# Patient Record
Sex: Female | Born: 1975 | Race: White | Hispanic: No | Marital: Single | Smoking: Never smoker
Health system: Southern US, Community
[De-identification: ages and names within clinical notes are randomized; demographics above are authoritative.]

## PROBLEM LIST (undated history)

## (undated) DIAGNOSIS — R112 Nausea with vomiting, unspecified: Secondary | ICD-10-CM

## (undated) DIAGNOSIS — K219 Gastro-esophageal reflux disease without esophagitis: Secondary | ICD-10-CM

## (undated) DIAGNOSIS — M5430 Sciatica, unspecified side: Secondary | ICD-10-CM

## (undated) DIAGNOSIS — Z9889 Other specified postprocedural states: Secondary | ICD-10-CM

## (undated) HISTORY — PX: BREAST SURGERY: SHX581

## (undated) HISTORY — PX: WISDOM TOOTH EXTRACTION: SHX21

---

## 2005-07-13 ENCOUNTER — Other Ambulatory Visit: Admission: RE | Admit: 2005-07-13 | Discharge: 2005-07-13 | Payer: Self-pay | Admitting: Endocrinology

## 2006-12-03 ENCOUNTER — Inpatient Hospital Stay (HOSPITAL_COMMUNITY): Admission: AD | Admit: 2006-12-03 | Discharge: 2006-12-03 | Payer: Self-pay | Admitting: Obstetrics and Gynecology

## 2007-02-23 ENCOUNTER — Ambulatory Visit (HOSPITAL_COMMUNITY): Admission: RE | Admit: 2007-02-23 | Discharge: 2007-02-23 | Payer: Self-pay | Admitting: Obstetrics & Gynecology

## 2007-02-28 ENCOUNTER — Inpatient Hospital Stay (HOSPITAL_COMMUNITY): Admission: AD | Admit: 2007-02-28 | Discharge: 2007-02-28 | Payer: Self-pay | Admitting: Obstetrics and Gynecology

## 2007-03-01 ENCOUNTER — Inpatient Hospital Stay (HOSPITAL_COMMUNITY): Admission: AD | Admit: 2007-03-01 | Discharge: 2007-03-01 | Payer: Self-pay | Admitting: Obstetrics and Gynecology

## 2007-03-29 ENCOUNTER — Inpatient Hospital Stay (HOSPITAL_COMMUNITY): Admission: AD | Admit: 2007-03-29 | Discharge: 2007-03-29 | Payer: Self-pay | Admitting: Specialist

## 2007-03-30 ENCOUNTER — Inpatient Hospital Stay (HOSPITAL_COMMUNITY): Admission: AD | Admit: 2007-03-30 | Discharge: 2007-03-30 | Payer: Self-pay | Admitting: Obstetrics and Gynecology

## 2007-11-16 ENCOUNTER — Inpatient Hospital Stay (HOSPITAL_COMMUNITY): Admission: AD | Admit: 2007-11-16 | Discharge: 2007-11-20 | Payer: Self-pay | Admitting: Obstetrics and Gynecology

## 2007-11-17 ENCOUNTER — Encounter: Payer: Self-pay | Admitting: Obstetrics and Gynecology

## 2007-11-26 ENCOUNTER — Inpatient Hospital Stay (HOSPITAL_COMMUNITY): Admission: AD | Admit: 2007-11-26 | Discharge: 2007-11-26 | Payer: Self-pay | Admitting: Obstetrics and Gynecology

## 2007-12-27 ENCOUNTER — Inpatient Hospital Stay (HOSPITAL_COMMUNITY): Admission: AD | Admit: 2007-12-27 | Discharge: 2007-12-27 | Payer: Self-pay | Admitting: Obstetrics and Gynecology

## 2007-12-30 ENCOUNTER — Inpatient Hospital Stay (HOSPITAL_COMMUNITY): Admission: AD | Admit: 2007-12-30 | Discharge: 2007-12-30 | Payer: Self-pay | Admitting: Obstetrics and Gynecology

## 2008-01-02 ENCOUNTER — Inpatient Hospital Stay (HOSPITAL_COMMUNITY): Admission: AD | Admit: 2008-01-02 | Discharge: 2008-01-02 | Payer: Self-pay | Admitting: Obstetrics and Gynecology

## 2008-01-02 ENCOUNTER — Ambulatory Visit: Payer: Self-pay | Admitting: Obstetrics & Gynecology

## 2008-01-09 ENCOUNTER — Ambulatory Visit: Payer: Self-pay | Admitting: Obstetrics & Gynecology

## 2008-01-19 ENCOUNTER — Inpatient Hospital Stay (HOSPITAL_COMMUNITY): Admission: RE | Admit: 2008-01-19 | Discharge: 2008-01-22 | Payer: Self-pay | Admitting: Obstetrics & Gynecology

## 2008-01-19 ENCOUNTER — Encounter (INDEPENDENT_AMBULATORY_CARE_PROVIDER_SITE_OTHER): Payer: Self-pay | Admitting: Obstetrics & Gynecology

## 2009-11-28 IMAGING — US US OB DETAIL EACH ADDL GEST + 14 WK
1 series · 18 of 28 positions shown · non-contrast
Comparison: none

OBSTETRICAL ULTRASOUND:
 This ultrasound was performed in The [HOSPITAL], and the [facility] report will be stored to [REDACTED] PACS.

[Series 1: us ob detail each addl gest + 14 wk · 18 of 73 slices shown]
[im 1/73]
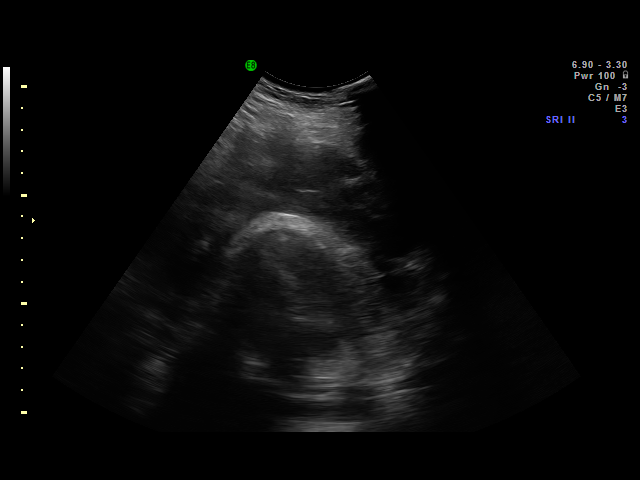
[im 6/73]
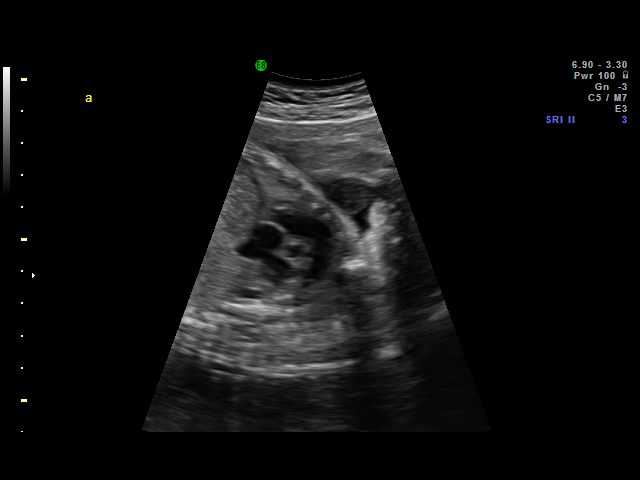
[im 9/73]
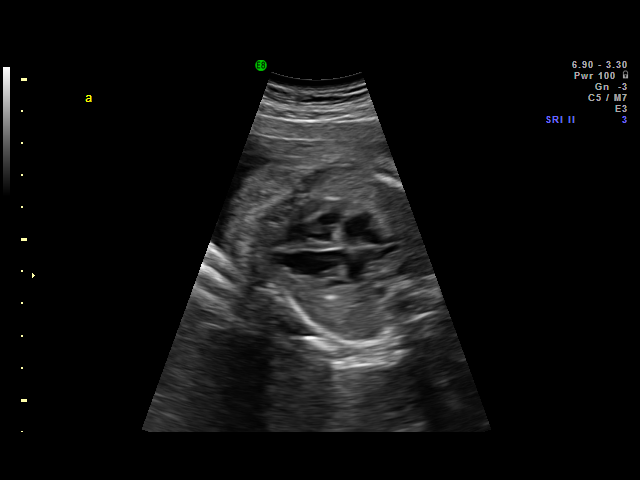
[im 14/73]
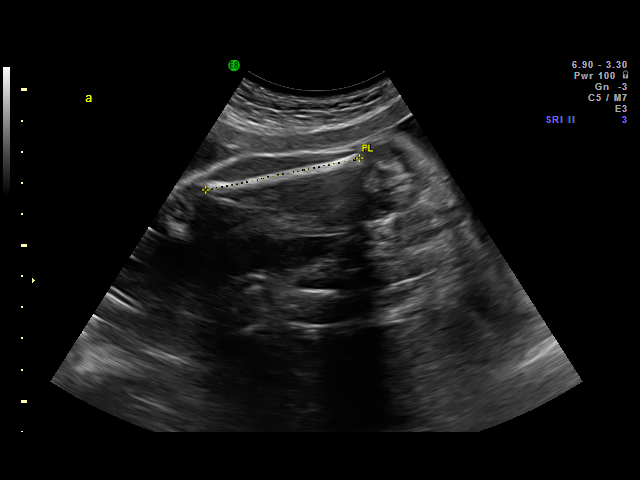
[im 19/73]
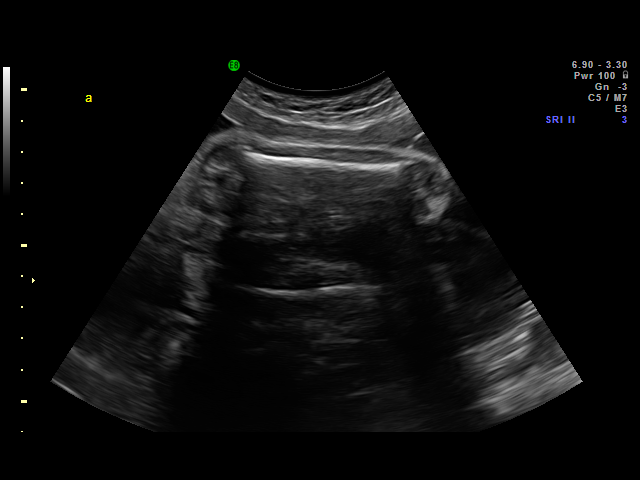
[im 22/73]
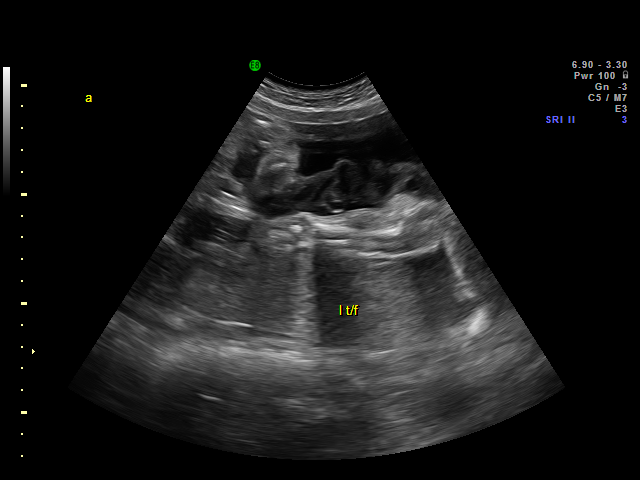
[im 27/73]
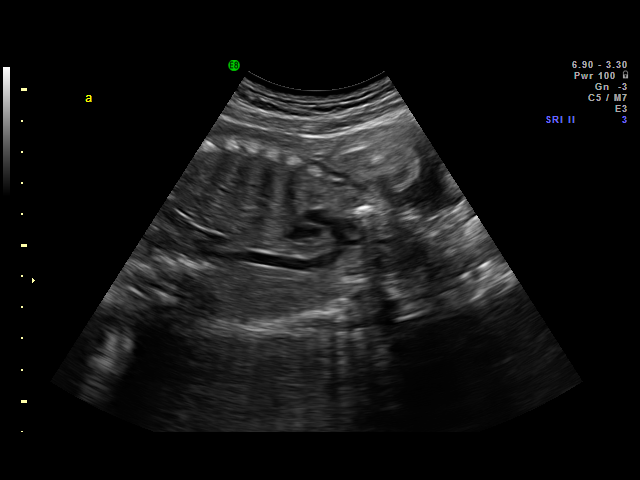
[im 30/73]
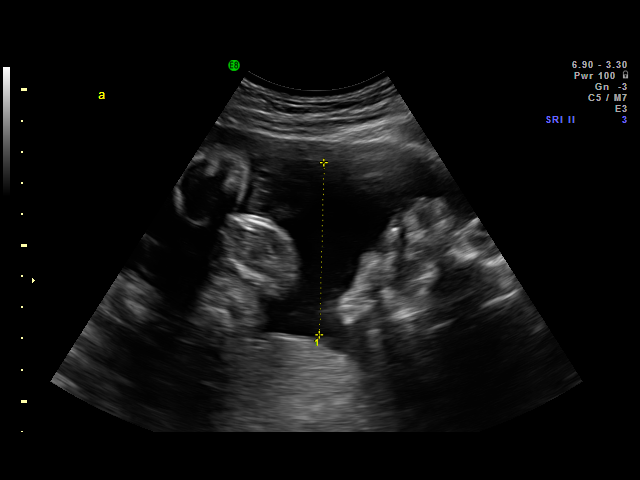
[im 35/73]
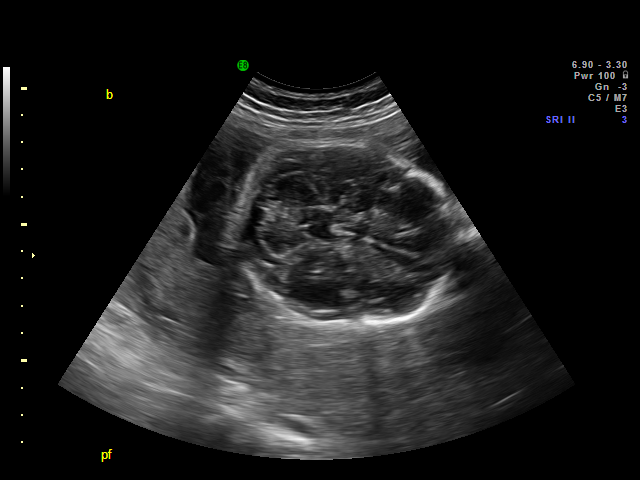
[im 38/73]
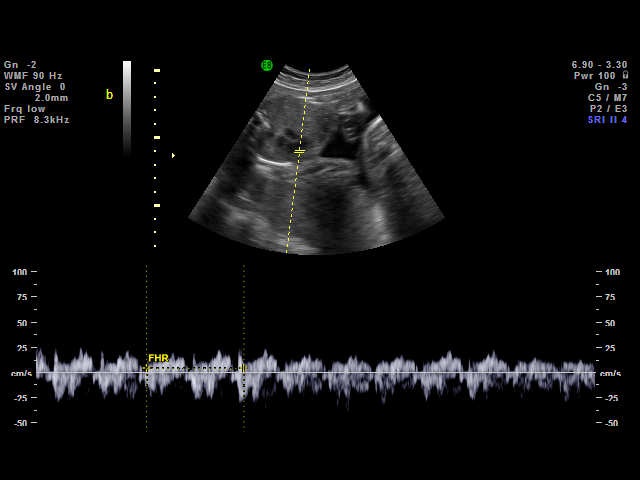
[im 43/73]
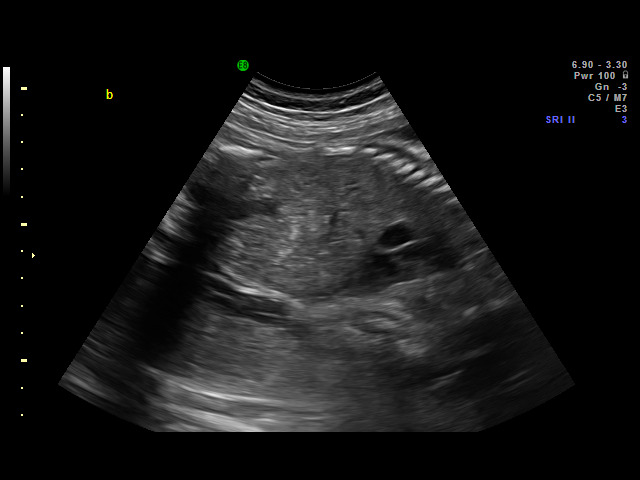
[im 46/73]
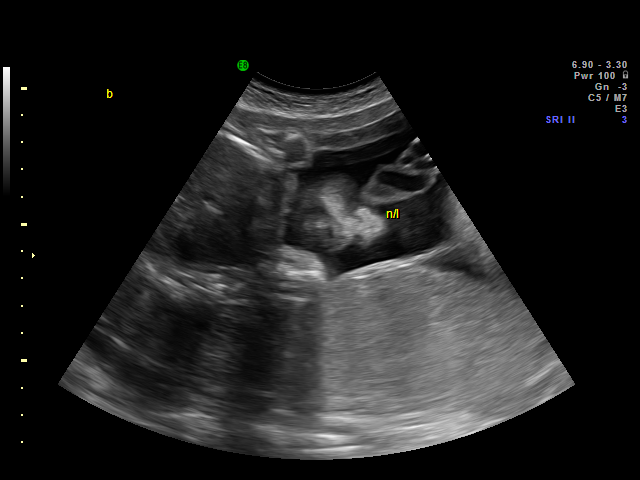
[im 51/73]
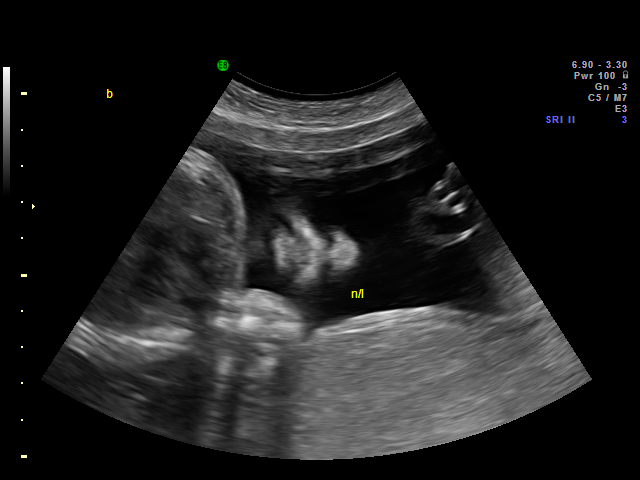
[im 57/73]
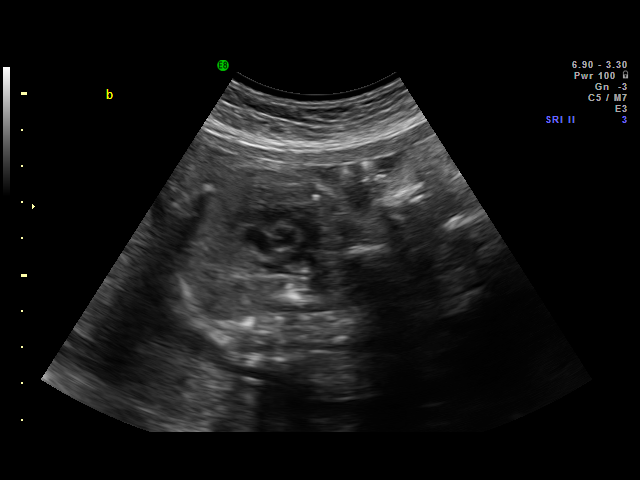
[im 59/73]
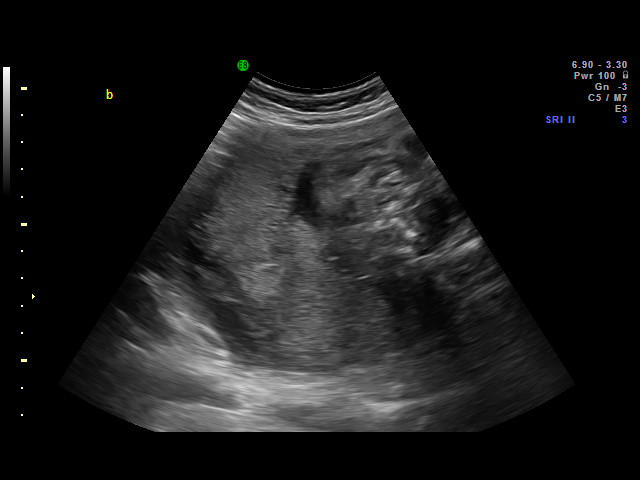
[im 65/73]
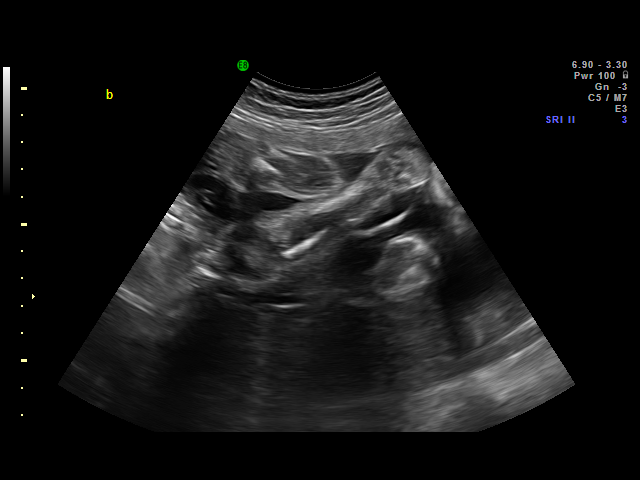
[im 67/73]
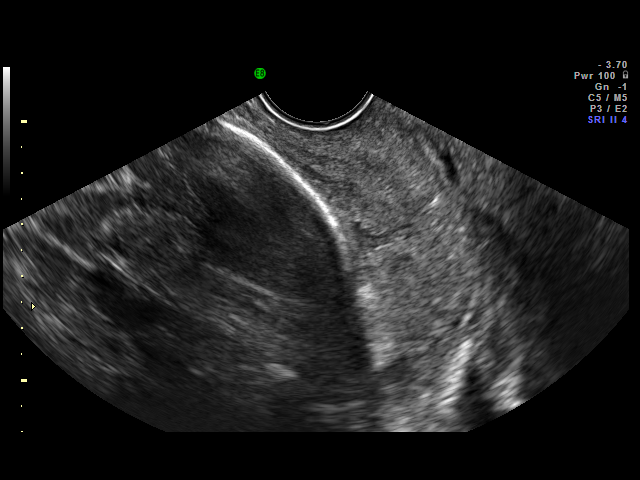
[im 73/73]
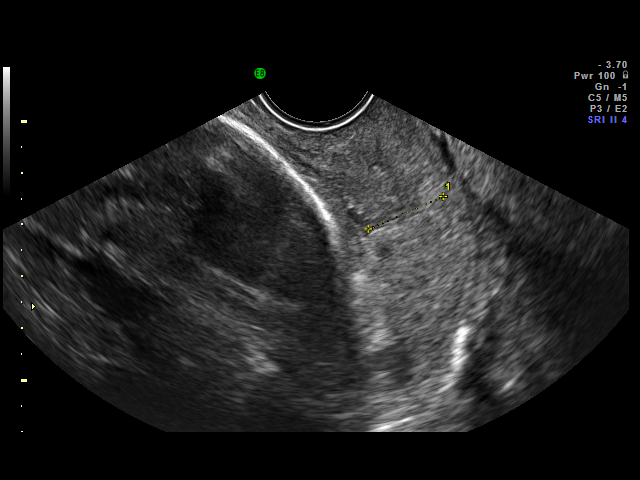

[18 of 28 positions shown; findings below may reference images not displayed]

IMPRESSION: [facility] has also been faxed to the ordering physician.

## 2010-02-17 ENCOUNTER — Other Ambulatory Visit: Payer: Self-pay | Admitting: Dermatology

## 2010-05-27 LAB — ANTIBODY SCREEN: Antibody Screen: NEGATIVE

## 2010-05-27 LAB — ABO/RH: RH Type: POSITIVE

## 2010-05-27 LAB — HIV ANTIBODY (ROUTINE TESTING W REFLEX): HIV: NONREACTIVE

## 2010-05-27 LAB — RUBELLA ANTIBODY, IGM: Rubella: IMMUNE

## 2010-06-16 NOTE — Op Note (Signed)
[name], [name]               ACCOUNT NO.:  [id number]   MEDICAL RECORD NO.:  [id number]           PATIENT TYPE:   LOCATION:                                FACILITY:  [facility]   PHYSICIAN:  Freddy Finner, M.D.   DATE OF BIRTH:  13-Feb-1975   DATE OF PROCEDURE:  01/19/2008  DATE OF DISCHARGE:  01/22/2008                               OPERATIVE REPORT   PREOPERATIVE DIAGNOSES:  Intrauterine pregnancy 36 and 5/[redacted] weeks  gestation with vertex-transverse-breech presentations A and B in that  order, preterm labor, active management of preterm labor, betamethasone  administration and early third trimester.   POSTOPERATIVE DIAGNOSES:  Intrauterine pregnancy 36 and 5/[redacted] weeks  gestation with vertex/transverse/breech presentations A and B in that  order, preterm labor, active management of preterm labor, betamethasone  administration and early third trimester with delivery of viable twins,  twin A was a girl with Apgars of 9 and 9, twin B was a boy with Apgars  of 9 and 9.   SURGEON:  Freddy Finner, M.D.   ASSISTANT:  Juluis Mire, M.D.   ESTIMATED INTRAOPERATIVE BLOOD LOSS:  800 mL.   ANESTHESIA:  Was spinal.   INTRAOPERATIVE COMPLICATIONS:  None.   DETAILS OF PRESENT ILLNESS:  Recorded in the admission note.  The  patient was admitted on the morning of surgery.  She was given a bolus  of Ancef IV preoperatively.  She was brought to the operating room,  placed under adequate spinal anesthesia, placed in dorsal lithotomy  position.  Betadine prep of the abdomen was performed in the usual  fashion.  Foley catheter was placed using sterile technique.  Sterile  drapes were applied.  The lower abdominal transverse incision was made  and carried sharply down to fascia.  Fascia was entered sharply and  extended to the extent of skin incision.  Rectus sheath was developed  with blunt and sharp dissection.  Rectus muscles divided in midline.  Peritoneum was elevated, entered sharply and  extended bluntly to the  external skin incision.  Bladder blade was placed.  Transverse incision  was made in the visceroperitoneum overlying the lower segment and the  bladder pushed off the lower segment.  A transverse incision was then  made in the lower uterine segment and extended bluntly in a transverse  direction.  Amniotic fluid in the first sac was clear.  The first twin,  a female, was easily delivered without difficulty.  Twin B was  determined to be transverse with back up.  With gentle abdominal  pressure he was converted to vertex and the membrane ruptured.  He too  was then delivered easily from the vertex presentation without  difficulty and his Apgars were 9 and 9.  Cord bloods were obtained from  both placentas for arterial sampling and for venous sampling.  Placentas  were removed and submitted for histologic exam.  The uterus was  delivered onto the anterior abdominal wall.  The cavity was confirmed  completely evacuated with manual exploration of the cavity.  Moist pack  was wrapped around the uterus, tubes  and ovaries all of which were  normal.  Uterine incision was closed in a double layer with running  locking zero Monocryl for the first layer and an imbricating suture of  zero Monocryl for the second layer.  The uterus was delivered back into  the abdominal cavity.  Hemostasis was noted to be complete.  Irrigation  was carried out.  All pack, needle and instrument counts were correct.  Abdominal incision was then closed in layers.  Running zero Monocryl was  used to close the peritoneum and reapproximate the rectus muscles.  Fascia was closed with a looped zero PDS running from angle to angle on  either side.  Subcutaneous tissue was approximated with 2-0 plain.  Skin  was closed with wide skin staples and quarter inch Steri-Strips.  The  patient tolerated the operative procedure well.  She was taken to the  recovery room in good condition.      Freddy Finner, M.D.  Electronically Signed     WRN/MEDQ  D:  03/09/2008  T:  03/09/2008  Job:  [id number]

## 2010-06-16 NOTE — H&P (Signed)
[name], [name]               ACCOUNT NO.:  [id number]   MEDICAL RECORD NO.:  [id number]          PATIENT TYPE:  INP   LOCATION:                                [facility]:  [facility]   PHYSICIAN:  Freddy Finner, M.D.   DATE OF BIRTH:  [birth year]   DATE OF ADMISSION:  01/19/2008  DATE OF DISCHARGE:  01/22/2008                              HISTORY & PHYSICAL   ADMISSION DIAGNOSIS:  Intrauterine pregnancy at 36-5/[redacted] weeks gestation  on the day of admission, twin pregnancy, preterm labor with advanced  cervical change, in vitro fertilization pregnancy, vertex twin A with  transverse twin B for cesarean delivery.   The patient is a 35 year old white married female primigravida who had  IVF with  transfer on May 27, 2007 who is now admitted at 36-5/[redacted] weeks  gestation for cesarean delivery.  Her prenatal course has been  complicated by preterm labor and preterm cervical change which has been  managed with progestin suppositories from early in the pregnancy with  oral tocolytics and with episodic hospital visits and admissions for  preterm labor.  She has now reached a 36-5/[redacted] weeks gestation.  Her  cervix was dilated at 1.5 cm.  She is 90% effaced, the vertex is at zero  station.  She is known to have vertex transverse presentation.  She is  now admitted for cesarean delivery.  The patient is known to be strep  positive by earlier vaginal culture.   REVIEW OF SYSTEMS:  Negative.   Past medical history and family history outlined in the [facility] record  and will not be repeated.   ALLERGIES:  NO KNOWN ALLERGIES TO MEDICATIONS.   PHYSICAL EXAMINATION:  HEENT:  Grossly within normal limits.  NECK:  Thyroid gland is not palpably enlarged.  VITAL SIGNS:  Blood pressure in the office 128/70, weight 147.5.  CHEST:  Clear to auscultation throughout.  HEART:  Normal sinus rhythm without murmurs, rubs or gallops.  ABDOMEN:  Gravid.  Fundal height of 40 cm.  EXTREMITIES:  Edema +2 of the lower  extremities without cyanosis or  clubbing.   DIAGNOSTICS:  Ultrasound findings on January 17, 2008, showed twin A to  be 2869 gm and had a biophysical profile of 8/8.  Twin B was 2435 gm and  had a biophysical of 8/8.   ASSESSMENT:  Intrauterine pregnancy 36-5/7 weeks, twins with complex  presentation.   PLAN:  Cesarean delivery.  The small possibility of preterm respiratory  distress syndrome has been discussed with the patient.  It is not felt  likely given the course of the pregnancy and her treatment to date.  She  did receive betamethasone earlier in the third trimester because of  preterm contractions.  She is now admitted and prepared to proceed with  surgery.      Freddy Finner, M.D.  Electronically Signed     WRN/MEDQ  D:  01/18/2008  T:  01/18/2008  Job:  [id number]

## 2010-06-16 NOTE — Discharge Summary (Signed)
NAMEJAEDAH, LORDS               ACCOUNT NO.:  [id number]   MEDICAL RECORD NO.:  [id number]          PATIENT TYPE:  INP   LOCATION:  9158                          [facility]ACILITY:  [facility]   PHYSICIAN:  Freddy Finner, M.D.   DATE OF BIRTH:  [birth year]   DATE OF ADMISSION:  11/16/2007  DATE OF DISCHARGE:  11/20/2007                               DISCHARGE SUMMARY   ADMITTING DIAGNOSES:  1. Intrauterine pregnancy at 27-4/7th weeks' estimated gestational      age.  2. Twin gestation.  3. Preterm labor.   DISCHARGE DIAGNOSES:  1. Intrauterine pregnancy at 28-1/7th weeks' estimated gestational      age.  2. Twin gestation.  3. Preterm labor, tocolysis.   REASON FOR ADMISSION:  Please see written H&P.   HOSPITAL COURSE:  The patient is a 35 year old primigravida that was  admitted to [facility] at 27-4/7th weeks' estimated  gestational age for tocolysis of her preterm labor.  The patient's  pregnancy had been complicated by twin gestation by in vitro  fertilization.  The patient had been seen in the office for routine  prenatal visit at which time she was found to be 1-2 cm dilated, 50%  effaced, at a -3 station.  Ultrasound was performed at [facility], which had revealed a cervical length of 1.53 cm and the babies  were in the vertex transverse presentation.  External fetal monitor  revealed some irritability with contractions approximately every 2-10  minutes.  Fetal heart tones are reactive.  Vital signs were stable.  The  patient was initially given subcu terbutaline without response.  The  patient was now admitted for magnesium sulfate for tocolysis of her  labor.  She was given IV Unasyn for prophylaxis regarding group B beta  strep and cervical culture was sent.  The patient was now started on  betamethasone and also given Prometrium 200 mg per vagina.  Ultrasound  was scheduled for the following morning.  On the following morning, the  patient was  without complaint.  Tocometer on the monitor did not reveal  any contractions.  Ultrasound did reveal cervix was closed with a length  of approximately 15 mm.  The patient continued on magnesium sulfate and  where she was to complete the steroid series.  On the following morning,  the patient continued to be without complaint.  Vital signs remained  stable.  No contractions were observed.  Abdomen soft and nontender.  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  The decision was made to discontinue the magnesium sulfate and  now restart the patient back on oral terbutaline.  The patient continued  on the Prometrium intravaginally.  On the following morning, the patient  continued to be without contractions.  She had tolerated the made  magnesium taper well.  Vital signs were stable.  Abdomen remained soft  and nontender.  The following morning, the patient continued to be  without complaint.  Vital signs were stable.  No traceable contractions  were observed.  Decision was made to discharge the  patient home on  complete bedrest.   CONDITION ON DISCHARGE:  Stable.   DIET:  Regular, as tolerated.   ACTIVITY:  Bed rest with bathroom privileges.   DISCHARGE MEDICATIONS:  1. Terbutaline 2.5 mg every 4 hours.  2. Prometrium 200 mg intravaginally at bedtime.  3. Ambien 10 mg nightly p.r.n.  4. Protonix 40 mg daily.   Otherwise, the patient was to return to the office in approximately 4  days for reevaluation.      Julio Sicks, N.P.      Freddy Finner, M.D.  Electronically Signed    CC/MEDQ  D:  12/13/2007  T:  12/13/2007  Job:  [id number]

## 2010-06-16 NOTE — Op Note (Signed)
NAMEKAYLIN, [name]               ACCOUNT NO.:  [id number]   MEDICAL RECORD NO.:  [id number]          PATIENT TYPE:  OUT   LOCATION:  XRAY                          [facility]ILITY:  [facility]   PHYSICIAN:  Freddy Finner, M.D.   DATE OF BIRTH:  Aug 25, 1975   DATE OF PROCEDURE:  02/23/2007  DATE OF DISCHARGE:                               OPERATIVE REPORT   OPERATIVE PROCEDURE:  Hysterosalpingogram.   PREOPERATIVE DIAGNOSIS:  Infertility.   POSTOPERATIVE DIAGNOSIS:  Normal hysterosalpingogram.   The patient is a 35 year old white married female who is undergoing  treatment for infertility.  She was admitted to the radiology department  today on day nine of her cycle for hysterosalpingogram.  She was brought  to the radiology suite for the procedure.  She was placed in the dorsal  lithotomy position  on the table.  A bivalve speculum was introduced  into the vagina.  Betadine prep of the cervix was carried out.  A double  lumen arterial type catheter was inserted into the cervix without  difficulty and the balloon filled with approximately 1 mL of air.  The  contrast medium was then injected and radiograms obtained showing prompt  filling of the uterine cavity which appeared to be normal.  Prompt  bilateral tubal spillage was normal appearing tubal architecture.  The  study was considered to be completely normal.  The instruments were  removed, the catheter was removed.  The results were discussed with the  patient and her husband and they were discharged home with routine  instructions to call for fever, for heavy bleeding, for severe pain.      Freddy Finner, M.D.  Electronically Signed     WRN/MEDQ  D:  02/23/2007  T:  02/23/2007  Job:  [id number]

## 2010-06-19 NOTE — Discharge Summary (Signed)
[name], [name]               ACCOUNT NO.:  [id number]   MEDICAL RECORD NO.:  [id number]          PATIENT TYPE:  INP   LOCATION:  9134                          [facility]ACILITY:  [facility]   PHYSICIAN:  Guy Sandifer. Henderson Cloud, M.D. DATE OF BIRTH:  Sep 30, 1975   DATE OF ADMISSION:  01/19/2008  DATE OF DISCHARGE:  01/22/2008                               DISCHARGE SUMMARY   ADMITTING DIAGNOSES:  1. Intrauterine pregnancy at 36-5/7 weeks estimated gestational age.  2. Twin gestation.  3. Complex presentation.  4. Preterm cervical change.   DISCHARGE DIAGNOSES:  1. Status post low transverse cesarean section.  2. A viable female and female infant.   PROCEDURE:  Primary low transverse cesarean section.   REASON FOR ADMISSION:  Please see dictated H and P.   HOSPITAL COURSE:  The patient is 35 year old white married female  primigravida was admitted to [facility] for scheduled  cesarean section.  The patient did have known twin gestation with a  complicated presentation.  Her prenatal course had been complicated by  preterm labor, preterm cervical change, which had been managed with  progesterone suppositories from the early part of her pregnancy and oral  tocolytics.  The patient also had a hospitalization during her pregnancy  for preterm labor.  The patient had now achieved 36-5/7 weeks estimated  gestational and decision was made now to admit the patient for cesarean  delivery.  On the morning of admission, the patient was taken to the  operating room where spinal anesthesia was administered without  difficulty.  A low transverse incision was made with the delivery of a  viable twin A female and twin B female with Apgars on those babies of 9 at  1 minute and 9 at 5 minutes.  The patient tolerated procedure well and  taken to the recovery room in stable condition.  On postoperative day #1  the patient was without complaint.  Vital signs were stable with blood  pressure 103/63.   Abdomen soft.  Incision was clean and dry.  Laboratory  findings showed hemoglobin of 9.8.  On postoperative day #2, the patient  complained of some shoulder pain, which had improved with heat, Motrin,  and Toradol.  Vital signs were stable.  Blood pressure 114/76 and  temperature 97.8.  Incision was clean, dry and intact.  The patient was  ambulating well.  On postoperative day #3, the patient was without  complaint.  Vital signs remained stable.  She was afebrile.  Abdomen  soft.  Fundus firm and nontender.  Incision was clean, dry, and intact.  Staples removed and the patient was later discharged home.   CONDITION ON DISCHARGE:  Stable.   DIET:  Regular as tolerated.   ACTIVITY:  No heavy lifting, no driving x2 weeks, no vaginal entry.   FOLLOWUP:  The patient to follow up in the office in 1-2 weeks for an  incision check.  She is to call for temperature greater than 100  degrees, persistent nausea, vomiting, heavy vaginal bleeding and/or  redness or drainage from incisional site.   DISCHARGE MEDICATIONS:  1.  Tylox #30 one p.o. every 4-6 hours p.r.n.  2. Motrin 600 mg every 6 hours.  3. Prenatal vitamins one p.o. daily.  4. Colace one p.o. daily p.r.n.      Julio Sicks, N.P.      Guy Sandifer. Henderson Cloud, M.D.  Electronically Signed    CC/MEDQ  D:  03/15/2008  T:  03/15/2008  Job:  [id number]

## 2010-11-02 LAB — STREP B DNA PROBE: Strep Group B Ag: POSITIVE

## 2010-11-02 LAB — URINALYSIS, ROUTINE W REFLEX MICROSCOPIC
Ketones, ur: NEGATIVE
Leukocytes, UA: NEGATIVE
Nitrite: NEGATIVE
Protein, ur: NEGATIVE
Urobilinogen, UA: 0.2

## 2010-11-02 LAB — MAGNESIUM: Magnesium: 5.3 — ABNORMAL HIGH

## 2010-11-03 LAB — COMPREHENSIVE METABOLIC PANEL
AST: 33
Albumin: 2.5 — ABNORMAL LOW
Chloride: 106
Creatinine, Ser: 0.52
GFR calc Af Amer: >60
Total Bilirubin: 0.4
Total Protein: 5.8 — ABNORMAL LOW

## 2010-11-03 LAB — FETAL FIBRONECTIN: Fetal Fibronectin: NEGATIVE

## 2010-11-03 LAB — URINE MICROSCOPIC-ADD ON

## 2010-11-03 LAB — URINALYSIS, ROUTINE W REFLEX MICROSCOPIC
Bilirubin Urine: NEGATIVE
Hgb urine dipstick: NEGATIVE
Nitrite: NEGATIVE
Specific Gravity, Urine: <1.005 — ABNORMAL LOW
pH: 6.5

## 2010-11-03 LAB — URINE CULTURE
Colony Count: NO GROWTH
Culture: NO GROWTH

## 2010-11-03 LAB — CBC
MCV: 95.3
Platelets: 198
RDW: 13.9
WBC: 9.1

## 2010-11-06 LAB — CBC
Hemoglobin: 12.9 g/dL (ref 12.0–15.0)
MCHC: 34.3 g/dL (ref 30.0–36.0)
Platelets: 173 10*3/uL (ref 150–400)
Platelets: 193 10*3/uL (ref 150–400)
RBC: 2.98 MIL/uL — ABNORMAL LOW (ref 3.87–5.11)
RDW: 14.3 % (ref 11.5–15.5)
WBC: 16 10*3/uL — ABNORMAL HIGH (ref 4.0–10.5)

## 2010-11-06 LAB — RPR: RPR Ser Ql: NONREACTIVE

## 2010-12-21 ENCOUNTER — Encounter (HOSPITAL_COMMUNITY): Payer: Self-pay

## 2010-12-21 NOTE — Patient Instructions (Addendum)
   Your procedure is scheduled on:  Monday, Nov 26th  Enter through the Main Entrance of [facility] at: 11:30am Pick up the phone at the desk and dial [phone] and inform us of your arrival.  Please call this number if you have any problems the morning of surgery: [phone]  Remember: Do not eat food after midnight: Sunday Do not drink clear liquids after: 9am Monday Take these medicines the morning of surgery with a SIP OF WATER:zantac  Do not wear jewelry, make-up, or FINGER nail polish Do not wear lotions, powders, or perfumes.  You may not  wear deodorant. Do not shave 48 hours prior to surgery. Do not bring valuables to the hospital.  Leave suitcase in the car. After Surgery it may be brought to your room. For patients being admitted to the hospital, checkout time is 11:00am the day of discharge.  Remember to use your hibiclens as instructed.Please shower with 1/2 bottle the evening before your surgery and the other 1/2 bottle the morning of surgery.

## 2010-12-22 ENCOUNTER — Encounter (HOSPITAL_COMMUNITY): Payer: Self-pay | Admitting: Pharmacist

## 2010-12-25 ENCOUNTER — Encounter (HOSPITAL_COMMUNITY): Payer: Self-pay

## 2010-12-25 ENCOUNTER — Encounter (HOSPITAL_COMMUNITY)
Admission: RE | Admit: 2010-12-25 | Discharge: 2010-12-25 | Disposition: A | Discharge status: Home / Self Care | Payer: BC Managed Care – PPO | Source: Ambulatory Visit | Attending: Obstetrics and Gynecology | Admitting: Obstetrics and Gynecology

## 2010-12-25 HISTORY — DX: Sciatica, unspecified side: M54.30

## 2010-12-25 HISTORY — DX: Gastro-esophageal reflux disease without esophagitis: K21.9

## 2010-12-25 HISTORY — DX: Other specified postprocedural states: Z98.890

## 2010-12-25 HISTORY — DX: Nausea with vomiting, unspecified: R11.2

## 2010-12-25 LAB — CBC
HCT: 36.7 % (ref 36.0–46.0)
MCH: 31.6 pg (ref 26.0–34.0)
MCV: 92 fL (ref 78.0–100.0)
Platelets: 189 10*3/uL (ref 150–400)
RDW: 13.2 % (ref 11.5–15.5)

## 2010-12-25 LAB — SURGICAL PCR SCREEN: Staphylococcus aureus: INVALID — AB

## 2010-12-27 NOTE — H&P (Addendum)
  H&P Dictated 11/245/12 2200 #[id number] DL Pt seen and examined and H&P is current 12/28/10 1300 DL

## 2010-12-28 ENCOUNTER — Encounter (HOSPITAL_COMMUNITY): Payer: Self-pay | Admitting: Anesthesiology

## 2010-12-28 ENCOUNTER — Encounter (HOSPITAL_COMMUNITY): Payer: Self-pay | Admitting: *Deleted

## 2010-12-28 ENCOUNTER — Inpatient Hospital Stay (HOSPITAL_COMMUNITY): Payer: BC Managed Care – PPO | Admitting: Anesthesiology

## 2010-12-28 ENCOUNTER — Encounter (HOSPITAL_COMMUNITY)
Admission: RE | Disposition: A | Discharge status: Home / Self Care | Payer: Self-pay | Source: Ambulatory Visit | Attending: Obstetrics and Gynecology

## 2010-12-28 ENCOUNTER — Inpatient Hospital Stay (HOSPITAL_COMMUNITY)
Admission: RE | Admit: 2010-12-28 | Discharge: 2010-12-30 | DRG: 371 | Disposition: A | Discharge status: Home / Self Care | Payer: BC Managed Care – PPO | Source: Ambulatory Visit | Attending: Obstetrics and Gynecology | Admitting: Obstetrics and Gynecology

## 2010-12-28 DIAGNOSIS — O09529 Supervision of elderly multigravida, unspecified trimester: Secondary | ICD-10-CM | POA: Diagnosis present

## 2010-12-28 DIAGNOSIS — O34219 Maternal care for unspecified type scar from previous cesarean delivery: Principal | ICD-10-CM | POA: Diagnosis present

## 2010-12-28 LAB — MRSA CULTURE

## 2010-12-28 SURGERY — Surgical Case
Anesthesia: Spinal | Site: Abdomen | Wound class: Clean Contaminated

## 2010-12-28 MED ORDER — NALBUPHINE HCL 10 MG/ML IJ SOLN: 5.0000 mg | INTRAMUSCULAR | Status: DC | PRN

## 2010-12-28 MED ORDER — PHENYLEPHRINE HCL 10 MG/ML IJ SOLN
INTRAMUSCULAR | Status: DC | PRN
  Administered 2010-12-28: 40 ug via INTRAVENOUS
  Administered 2010-12-28: 80 ug via INTRAVENOUS
  Administered 2010-12-28 (×4): 40 ug via INTRAVENOUS
  Administered 2010-12-28: 80 ug via INTRAVENOUS
  Administered 2010-12-28 (×3): 40 ug via INTRAVENOUS

## 2010-12-28 MED ORDER — DIPHENHYDRAMINE HCL 50 MG/ML IJ SOLN: 25.0000 mg | INTRAMUSCULAR | Status: DC | PRN

## 2010-12-28 MED ORDER — IBUPROFEN 600 MG PO TABS
600.0000 mg | ORAL_TABLET | Freq: Four times a day (QID) | ORAL | Status: DC
  Administered 2010-12-28 – 2010-12-30 (×7): 600 mg via ORAL
  Filled 2010-12-28 (×2): qty 1

## 2010-12-28 MED ORDER — DIPHENHYDRAMINE HCL 50 MG/ML IJ SOLN: 12.5000 mg | INTRAMUSCULAR | Status: DC | PRN

## 2010-12-28 MED ORDER — ONDANSETRON HCL 4 MG/2ML IJ SOLN: 4.0000 mg | Freq: Three times a day (TID) | INTRAMUSCULAR | Status: DC | PRN

## 2010-12-28 MED ORDER — ACETAMINOPHEN 325 MG PO TABS: 325.0000 mg | ORAL_TABLET | ORAL | Status: DC | PRN

## 2010-12-28 MED ORDER — SODIUM CHLORIDE 0.9 % IJ SOLN: 3.0000 mL | INTRAMUSCULAR | Status: DC | PRN

## 2010-12-28 MED ORDER — DIPHENHYDRAMINE HCL 25 MG PO CAPS: 25.0000 mg | ORAL_CAPSULE | ORAL | Status: DC | PRN

## 2010-12-28 MED ORDER — SCOPOLAMINE 1 MG/3DAYS TD PT72
1.0000 | MEDICATED_PATCH | Freq: Once | TRANSDERMAL | Status: DC
  Administered 2010-12-28: 1.5 mg via TRANSDERMAL

## 2010-12-28 MED ORDER — SODIUM CHLORIDE 0.9 % IR SOLN
Status: DC | PRN
  Administered 2010-12-28: 1000 mL

## 2010-12-28 MED ORDER — DIBUCAINE 1 % RE OINT: 1.0000 "application " | TOPICAL_OINTMENT | RECTAL | Status: DC | PRN

## 2010-12-28 MED ORDER — LANOLIN HYDROUS EX OINT: 1.0000 "application " | TOPICAL_OINTMENT | CUTANEOUS | Status: DC | PRN

## 2010-12-28 MED ORDER — IBUPROFEN 600 MG PO TABS
600.0000 mg | ORAL_TABLET | Freq: Four times a day (QID) | ORAL | Status: DC | PRN
  Filled 2010-12-28 (×6): qty 1

## 2010-12-28 MED ORDER — ZOLPIDEM TARTRATE 5 MG PO TABS: 5.0000 mg | ORAL_TABLET | Freq: Every evening | ORAL | Status: DC | PRN

## 2010-12-28 MED ORDER — ONDANSETRON HCL 4 MG/2ML IJ SOLN
INTRAMUSCULAR | Status: DC | PRN
  Administered 2010-12-28: 4 mg via INTRAVENOUS

## 2010-12-28 MED ORDER — LACTATED RINGERS IV SOLN
INTRAVENOUS | Status: DC
  Administered 2010-12-28 (×4): via INTRAVENOUS

## 2010-12-28 MED ORDER — KETOROLAC TROMETHAMINE 30 MG/ML IJ SOLN
30.0000 mg | Freq: Four times a day (QID) | INTRAMUSCULAR | Status: AC | PRN
  Administered 2010-12-28: 30 mg via INTRAMUSCULAR

## 2010-12-28 MED ORDER — SIMETHICONE 80 MG PO CHEW
80.0000 mg | CHEWABLE_TABLET | Freq: Three times a day (TID) | ORAL | Status: DC
  Administered 2010-12-28 – 2010-12-30 (×6): 80 mg via ORAL

## 2010-12-28 MED ORDER — KETOROLAC TROMETHAMINE 30 MG/ML IJ SOLN: 30.0000 mg | Freq: Four times a day (QID) | INTRAMUSCULAR | Status: AC | PRN

## 2010-12-28 MED ORDER — PROMETHAZINE HCL 25 MG/ML IJ SOLN: 6.2500 mg | INTRAMUSCULAR | Status: DC | PRN

## 2010-12-28 MED ORDER — FENTANYL CITRATE 0.05 MG/ML IJ SOLN: 25.0000 ug | INTRAMUSCULAR | Status: DC | PRN

## 2010-12-28 MED ORDER — PRENATAL PLUS 27-1 MG PO TABS
1.0000 | ORAL_TABLET | Freq: Every day | ORAL | Status: DC
  Administered 2010-12-29 – 2010-12-30 (×2): 1 via ORAL
  Filled 2010-12-28 (×3): qty 1

## 2010-12-28 MED ORDER — WITCH HAZEL-GLYCERIN EX PADS: 1.0000 "application " | MEDICATED_PAD | CUTANEOUS | Status: DC | PRN

## 2010-12-28 MED ORDER — PRENATAL PLUS 27-1 MG PO TABS: 1.0000 | ORAL_TABLET | Freq: Every day | ORAL | Status: DC

## 2010-12-28 MED ORDER — ACETAMINOPHEN 10 MG/ML IV SOLN: 1000.0000 mg | Freq: Four times a day (QID) | INTRAVENOUS | Status: AC | PRN

## 2010-12-28 MED ORDER — OXYCODONE-ACETAMINOPHEN 5-325 MG PO TABS
1.0000 | ORAL_TABLET | ORAL | Status: DC | PRN
  Administered 2010-12-29 – 2010-12-30 (×6): 1 via ORAL
  Filled 2010-12-28 (×6): qty 1

## 2010-12-28 MED ORDER — OXYTOCIN 20 UNITS IN LACTATED RINGERS INFUSION - SIMPLE
INTRAVENOUS | Status: DC | PRN
  Administered 2010-12-28 (×2): 20 [IU] via INTRAVENOUS

## 2010-12-28 MED ORDER — DIPHENHYDRAMINE HCL 25 MG PO CAPS: 25.0000 mg | ORAL_CAPSULE | Freq: Four times a day (QID) | ORAL | Status: DC | PRN

## 2010-12-28 MED ORDER — BUPIVACAINE IN DEXTROSE 0.75-8.25 % IT SOLN
INTRATHECAL | Status: DC | PRN
  Administered 2010-12-28: 11 mg via INTRATHECAL

## 2010-12-28 MED ORDER — MENTHOL 3 MG MT LOZG: 1.0000 | LOZENGE | OROMUCOSAL | Status: DC | PRN

## 2010-12-28 MED ORDER — MEPERIDINE HCL 25 MG/ML IJ SOLN: 6.2500 mg | INTRAMUSCULAR | Status: DC | PRN

## 2010-12-28 MED ORDER — OXYTOCIN 20 UNITS IN LACTATED RINGERS INFUSION - SIMPLE: 125.0000 mL/h | INTRAVENOUS | Status: AC

## 2010-12-28 MED ORDER — TETANUS-DIPHTH-ACELL PERTUSSIS 5-2.5-18.5 LF-MCG/0.5 IM SUSP: 0.5000 mL | Freq: Once | INTRAMUSCULAR | Status: DC

## 2010-12-28 MED ORDER — SODIUM CHLORIDE 0.9 % IV SOLN: 1.0000 ug/kg/h | INTRAVENOUS | Status: DC | PRN

## 2010-12-28 MED ORDER — DEXTROSE 5 % IV SOLN
1.0000 g | INTRAVENOUS | Status: AC
  Administered 2010-12-28: 1 g via INTRAVENOUS
  Filled 2010-12-28: qty 1

## 2010-12-28 MED ORDER — MORPHINE SULFATE (PF) 0.5 MG/ML IJ SOLN
INTRAMUSCULAR | Status: DC | PRN
  Administered 2010-12-28: .1 mg via INTRATHECAL

## 2010-12-28 MED ORDER — METOCLOPRAMIDE HCL 5 MG/ML IJ SOLN: 10.0000 mg | Freq: Three times a day (TID) | INTRAMUSCULAR | Status: DC | PRN

## 2010-12-28 MED ORDER — LACTATED RINGERS IV SOLN
INTRAVENOUS | Status: DC
  Administered 2010-12-28: 18:00:00 via INTRAVENOUS

## 2010-12-28 MED ORDER — SIMETHICONE 80 MG PO CHEW: 80.0000 mg | CHEWABLE_TABLET | ORAL | Status: DC | PRN

## 2010-12-28 MED ORDER — SENNOSIDES-DOCUSATE SODIUM 8.6-50 MG PO TABS
2.0000 | ORAL_TABLET | Freq: Every day | ORAL | Status: DC
  Administered 2010-12-29: 2 via ORAL

## 2010-12-28 MED ORDER — SCOPOLAMINE 1 MG/3DAYS TD PT72: 1.0000 | MEDICATED_PATCH | Freq: Once | TRANSDERMAL | Status: DC

## 2010-12-28 MED ORDER — FENTANYL CITRATE 0.05 MG/ML IJ SOLN
INTRAMUSCULAR | Status: DC | PRN
  Administered 2010-12-28: 25 ug via INTRATHECAL

## 2010-12-28 MED ORDER — ONDANSETRON HCL 4 MG PO TABS: 4.0000 mg | ORAL_TABLET | ORAL | Status: DC | PRN

## 2010-12-28 MED ORDER — NALOXONE HCL 0.4 MG/ML IJ SOLN: 0.4000 mg | INTRAMUSCULAR | Status: DC | PRN

## 2010-12-28 MED ORDER — ONDANSETRON HCL 4 MG/2ML IJ SOLN: 4.0000 mg | INTRAMUSCULAR | Status: DC | PRN

## 2010-12-28 SURGICAL SUPPLY — 28 items
CLOTH BEACON ORANGE TIMEOUT ST (SAFETY) ×2 IMPLANT
DRESSING TELFA 8X3 (GAUZE/BANDAGES/DRESSINGS) IMPLANT
DRSG COVADERM 4X6 (GAUZE/BANDAGES/DRESSINGS) ×1 IMPLANT
ELECT REM PT RETURN 9FT ADLT (ELECTROSURGICAL) ×2
ELECTRODE REM PT RTRN 9FT ADLT (ELECTROSURGICAL) ×1 IMPLANT
EXTRACTOR VACUUM M CUP 4 TUBE (SUCTIONS) IMPLANT
GAUZE SPONGE 4X4 12PLY STRL LF (GAUZE/BANDAGES/DRESSINGS) IMPLANT
GLOVE BIO SURGEON STRL SZ7.5 (GLOVE) ×1 IMPLANT
GLOVE BIO SURGEON STRL SZ8 (GLOVE) ×2 IMPLANT
GLOVE SURG ORTHO 8.0 STRL STRW (GLOVE) ×2 IMPLANT
GOWN PREVENTION PLUS LG XLONG (DISPOSABLE) ×4 IMPLANT
KIT ABG SYR 3ML LUER SLIP (SYRINGE) ×2 IMPLANT
NDL HYPO 25X5/8 SAFETYGLIDE (NEEDLE) ×1 IMPLANT
NEEDLE HYPO 25X5/8 SAFETYGLIDE (NEEDLE) ×2 IMPLANT
NS IRRIG 1000ML POUR BTL (IV SOLUTION) ×2 IMPLANT
PACK C SECTION WH (CUSTOM PROCEDURE TRAY) ×2 IMPLANT
PAD ABD 7.5X8 STRL (GAUZE/BANDAGES/DRESSINGS) IMPLANT
SLEEVE SCD COMPRESS KNEE MED (MISCELLANEOUS) IMPLANT
STAPLER VISISTAT 35W (STAPLE) IMPLANT
SUT MNCRL 0 VIOLET CTX 36 (SUTURE) ×3 IMPLANT
SUT MONOCRYL 0 CTX 36 (SUTURE) ×3
SUT PDS AB 1 CT  36 (SUTURE)
SUT PDS AB 1 CT 36 (SUTURE) IMPLANT
SUT VIC AB 1 CTX 36 (SUTURE) ×2
SUT VIC AB 1 CTX36XBRD ANBCTRL (SUTURE) IMPLANT
TOWEL OR 17X24 6PK STRL BLUE (TOWEL DISPOSABLE) ×4 IMPLANT
TRAY FOLEY CATH 14FR (SET/KITS/TRAYS/PACK) ×2 IMPLANT
WATER STERILE IRR 1000ML POUR (IV SOLUTION) ×1 IMPLANT

## 2010-12-28 NOTE — Transfer of Care (Signed)
Immediate Anesthesia Transfer of Care Note  Patient: [name]  Procedure(s) Performed:  CESAREAN SECTION - repeat  Patient Location: PACU  Anesthesia Type: Spinal  Level of Consciousness: awake, alert  and oriented  Airway & Oxygen Therapy: Patient Spontanous Breathing  Post-op Assessment: Report given to PACU RN and Post -op Vital signs reviewed and stable  Post vital signs: Reviewed and stable  Complications: No apparent anesthesia complications

## 2010-12-28 NOTE — Anesthesia Preprocedure Evaluation (Signed)
Anesthesia Evaluation  Patient identified by MRN, date of birth, ID band Patient awake    Reviewed: Allergy & Precautions, H&P , Patient's Chart, lab work & pertinent test results  History of Anesthesia Complications (+) PONV and Family history of anesthesia reaction  Airway Mallampati: II TM Distance: >3 FB Neck ROM: full    Dental No notable dental hx.    Pulmonary neg pulmonary ROS,  clear to auscultation  Pulmonary exam normal       Cardiovascular neg cardio ROS regular Normal    Neuro/Psych Negative Neurological ROS  Negative Psych ROS   GI/Hepatic negative GI ROS, Neg liver ROS, GERD-  ,  Endo/Other  Negative Endocrine ROS  Renal/GU negative Renal ROS     Musculoskeletal   Abdominal   Peds  Hematology negative hematology ROS (+)   Anesthesia Other Findings   Reproductive/Obstetrics (+) Pregnancy                           Anesthesia Physical Anesthesia Plan  ASA: II  Anesthesia Plan: Spinal   Post-op Pain Management:    Induction:   Airway Management Planned:   Additional Equipment:   Intra-op Plan:   Post-operative Plan:   Informed Consent: I have reviewed the patients History and Physical, chart, labs and discussed the procedure including the risks, benefits and alternatives for the proposed anesthesia with the patient or authorized representative who has indicated his/her understanding and acceptance.     Plan Discussed with:   Anesthesia Plan Comments:         Anesthesia Quick Evaluation

## 2010-12-28 NOTE — Anesthesia Procedure Notes (Signed)
Spinal  Patient location during procedure: OR Preanesthetic Checklist Completed: patient identified, site marked, surgical consent, pre-op evaluation, timeout performed, IV checked, risks and benefits discussed and monitors and equipment checked Spinal Block Patient position: sitting Prep: DuraPrep Patient monitoring: heart rate, cardiac monitor, continuous pulse ox and blood pressure Approach: midline Location: L3-4 Injection technique: single-shot Needle Needle type: Sprotte  Needle gauge: 24 G Needle length: 9 cm Assessment Sensory level: T4 Additional Notes Patient identified.  Risk benefits discussed including failed block, incomplete pain control, headache, nerve damage, paralysis, blood pressure changes, nausea, vomiting, reactions to medication both toxic or allergic, and postpartum back pain.  Patient expressed understanding and wished to proceed.  All questions were answered.  Sterile technique used throughout procedure.  CSF was clear.  No parasthesia or other complications.  Please see nursing notes for vital signs.    

## 2010-12-28 NOTE — Anesthesia Postprocedure Evaluation (Signed)
Anesthesia Post Note  Patient: [name]  Procedure(s) Performed:  CESAREAN SECTION - repeat  Anesthesia type: Spinal  Patient location: PACU  Post pain: Pain level controlled  Post assessment: Post-op Vital signs reviewed  Last Vitals:  Filed Vitals:   12/28/10 1430  BP: 104/65  Pulse: 74  Temp:   Resp: 17    Post vital signs: Reviewed  Level of consciousness: awake  Complications: No apparent anesthesia complications

## 2010-12-28 NOTE — H&P (Unsigned)
NAMEKENEDY, HAISLEY               ACCOUNT NO.:  [id number]  MEDICAL RECORD NO.:  [id number]  LOCATION:                                 FACILITY:  PHYSICIAN:  Dineen Kid. Rana Snare, M.D.    DATE OF BIRTH:  September 14, 1975  DATE OF ADMISSION:  12/28/2010 DATE OF DISCHARGE:                             HISTORY & PHYSICAL   HISTORY OF PRESENT ILLNESS:  Ms. Buch is a 35 year old, G2, P1 at [redacted] weeks gestational age with [facility] of January 01, 2011.  She presents for repeat cesarean section.  She had previous cesarean section for twins and desires repeat cesarean section.  Pregnancy was complicated by advanced maternal age with a normal first trimester screen and normal level 2 ultrasound, and normal AFP.  PAST MEDICAL HISTORY:  Negative.  PAST SURGICAL HISTORY:  Cesarean section for twins.  MEDICATIONS:  Prenatal vitamins.  ALLERGIES:  She has no known drug allergies.  PHYSICAL EXAMINATION:  VITAL SIGNS:  Blood pressure is 98/60. HEART:  Regular rate and rhythm. LUNGS:  Clear to auscultation bilaterally. ABDOMEN:  Gravid, nontender.  Fetal heart rates in the 140s.  Cervix is closed, thick, and high.  IMPRESSION:  Intrauterine pregnancy at 39+ weeks gestational age. Previous cesarean section desiring repeat.  PLAN:  Repeat low segment transverse cesarean section.  Risks and benefits were discussed.  Informed consent was obtained.     Dineen Kid Rana Snare, M.D.     DCL/MEDQ  D:  12/27/2010  T:  12/27/2010  Job:  [id number]

## 2010-12-28 NOTE — Op Note (Signed)
Cesarean Section Procedure Note  Pre-operative Diagnosis: IUP at 39 weeks, Prev C/S desires repeat  Post-operative Diagnosis: same  Surgeon: Turner Daniels   Assistants: Jennette Kettle  Anesthesia:Spinal  Procedure:  Low Segment Transverse cesarean section  Procedure Details  The patient was seen in the Holding Room. The risks, benefits, complications, treatment options, and expected outcomes were discussed with the patient.  The patient concurred with the proposed plan, giving informed consent.  The site of surgery properly noted/marked.. A Time Out was held and the above information confirmed.  After induction of anesthesia, the patient was draped and prepped in the usual sterile manner. A Pfannenstiel incision was made and carried down through the subcutaneous tissue to the fascia. Fascial incision was made and extended transversely. The fascia was separated from the underlying rectus tissue superiorly and inferiorly. The peritoneum was identified and entered. Peritoneal incision was extended longitudinally. The utero-vesical peritoneal reflection was incised transversely and the bladder flap was bluntly freed from the lower uterine segment. A low transverse uterine incision was made. Delivered from vertex presentation was a 8+3 pound baby with Apgar scores of 8 at one minute and 9 at five minutes. After the umbilical cord was clamped and cut cord blood was obtained for evaluation. The placenta was removed intact and appeared normal. The uterine outline, tubes and ovaries appeared normal. The uterine incision was closed with running locked sutures of 0 monocryl and imbricated with 0 monocryl. Hemostasis was observed. Lavage was carried out until clear. The peritoneum was then closed with 0 monocryl and rectus muscles plicated in the midline.  After hemostasis was assured, the fascia was then reapproximated with running sutures of 0 Vicryl. Irrigation was applied and after adequate hemostasis was assured, the  skin was reapproximated with staples.  Instrument, sponge, and needle counts were correct prior the abdominal closure and at the conclusion of the case. The patient received 1 gram cefotetan preoperatively.  Findings: Viable female, ph art pending  Estimated Blood Loss:  700         Specimens: Placenta was sent to L&D         Complications:  None

## 2010-12-29 ENCOUNTER — Encounter (HOSPITAL_COMMUNITY): Payer: Self-pay | Admitting: Obstetrics and Gynecology

## 2010-12-29 LAB — CBC
HCT: 29.2 % — ABNORMAL LOW (ref 36.0–46.0)
MCHC: 33.6 g/dL (ref 30.0–36.0)
Platelets: 153 10*3/uL (ref 150–400)
RDW: 13.5 % (ref 11.5–15.5)
WBC: 10.7 10*3/uL — ABNORMAL HIGH (ref 4.0–10.5)

## 2010-12-29 NOTE — Progress Notes (Signed)
Subjective: Postpartum Day 1: Cesarean Delivery Patient reports tolerating PO.    Objective: Vital signs in last 24 hours: Temp:  [97.5 F (36.4 C)-98.7 F (37.1 C)] 98.3 F (36.8 C) (11/27 0610) Pulse Rate:  [63-102] 67  (11/27 0610) Resp:  [17-30] 18  (11/27 0610) BP: (86-116)/(51-86) 86/51 mmHg (11/27 0610) SpO2:  [96 %-100 %] 97 % (11/27 0200) Weight:  [63.504 kg (140 lb)] 140 lb (63.504 kg) (11/26 1800)  Physical Exam:  General: alert and cooperative Lochia: appropriate Uterine Fundus: firm abd dressing noted with sm old drainage noted on bandage BS+ Foley discontinued, patient has not voided at the time of rounds DVT Evaluation: No evidence of DVT seen on physical exam.   Basename 12/29/10 0513  HGB 9.8*  HCT 29.2*    Assessment/Plan: Status post Cesarean section. Doing well postoperatively.  Continue current care.  CURTIS,CAROL G 12/29/2010, 7:57 AM

## 2010-12-29 NOTE — Anesthesia Postprocedure Evaluation (Signed)
  Anesthesia Post-op Note  Patient: [name]  Procedure(s) Performed:  CESAREAN SECTION - Repeat Cesarean Section Delivery Baby Boy @ [phone], Apgars 8/9  Patient Location:   Anesthesia Type: Epidural  Level of Consciousness: awake  Airway and Oxygen Therapy: Patient Spontanous Breathing  Post-op Pain: none  Post-op Assessment: Post-op Vital signs reviewed  Post-op Vital Signs: Reviewed and stable  Complications: No apparent anesthesia complications

## 2010-12-29 NOTE — Addendum Note (Signed)
Addendum  created 12/29/10 0748 by Suella Grove   Modules edited:Notes Section

## 2010-12-30 ENCOUNTER — Encounter (HOSPITAL_COMMUNITY)
Admission: RE | Admit: 2010-12-30 | Discharge: 2010-12-30 | Disposition: A | Discharge status: Home / Self Care | Payer: BC Managed Care – PPO | Source: Ambulatory Visit | Attending: Obstetrics and Gynecology | Admitting: Obstetrics and Gynecology

## 2010-12-30 DIAGNOSIS — O923 Agalactia: Secondary | ICD-10-CM | POA: Insufficient documentation

## 2010-12-30 MED ORDER — OXYCODONE-ACETAMINOPHEN 5-325 MG PO TABS: 1.0000 | ORAL_TABLET | ORAL | Status: AC | PRN

## 2010-12-30 MED ORDER — IBUPROFEN 600 MG PO TABS: 600.0000 mg | ORAL_TABLET | Freq: Four times a day (QID) | ORAL | Status: AC | PRN

## 2010-12-30 NOTE — Progress Notes (Signed)
Subjective: Postpartum Day 2: Cesarean Delivery Patient reports tolerating PO, + flatus and no problems voiding.    Objective: Vital signs in last 24 hours: Temp:  [97.7 F (36.5 C)-99 F (37.2 C)] 97.7 F (36.5 C) (11/28 0500) Pulse Rate:  [60-96] 89  (11/28 0500) Resp:  [18-20] 18  (11/28 0500) BP: (90-98)/(56-67) 97/64 mmHg (11/28 0500) SpO2:  [96 %] 96 % (11/27 0926)  Physical Exam:  General: alert and cooperative Lochia: appropriate Uterine Fundus: firm Incision: healing well DVT Evaluation: No evidence of DVT seen on physical exam.   Basename 12/29/10 0513  HGB 9.8*  HCT 29.2*    Assessment/Plan: Status post Cesarean section. Doing well postoperatively.  Discharge home with standard precautions and return to clinic in 2 days for staple removal.  Khyla Mccumbers G 12/30/2010, 7:56 AM

## 2010-12-30 NOTE — Discharge Summary (Signed)
Obstetric Discharge Summary Reason for Admission: cesarean section Prenatal Procedures: ultrasound Intrapartum Procedures: cesarean: low cervical, transverse Postpartum Procedures: none Complications-Operative and Postpartum: none Hemoglobin  Date Value Range Status  12/29/2010 9.8* 12.0-15.0 (g/dL) Final     HCT  Date Value Range Status  12/29/2010 29.2* 36.0-46.0 (%) Final    Discharge Diagnoses: Term Pregnancy-delivered  Discharge Information: Date: 12/30/2010 Activity: pelvic rest Diet: routine Medications: PNV, Ibuprofen and Percocet Condition: stable Instructions: refer to practice specific booklet Discharge to: home   Newborn Data: Live born female  Birth Weight: 8 lb 2.3 oz (3695 g) APGAR: 8, 9  Home with mother.  [name] G 12/30/2010, 8:16 AM

## 2010-12-30 NOTE — Progress Notes (Signed)
Sw referral received for history of PP depression however pt discharged before Sw could see.

## 2010-12-30 NOTE — Plan of Care (Signed)
Problem: Discharge Progression Outcomes Goal: Remove staples per MD order Outcome: Not Applicable Date Met:  12/30/10 Return to MD office in 2 days for staple removal

## 2011-01-30 ENCOUNTER — Encounter (HOSPITAL_COMMUNITY)
Admission: RE | Admit: 2011-01-30 | Discharge: 2011-01-30 | Disposition: A | Discharge status: Home / Self Care | Payer: BC Managed Care – PPO | Source: Ambulatory Visit | Attending: Obstetrics and Gynecology | Admitting: Obstetrics and Gynecology

## 2011-01-30 DIAGNOSIS — O923 Agalactia: Secondary | ICD-10-CM | POA: Insufficient documentation

## 2011-03-02 ENCOUNTER — Encounter (HOSPITAL_COMMUNITY)
Admission: RE | Admit: 2011-03-02 | Discharge: 2011-03-02 | Disposition: A | Discharge status: Home / Self Care | Payer: BC Managed Care – PPO | Source: Ambulatory Visit | Attending: Obstetrics and Gynecology | Admitting: Obstetrics and Gynecology

## 2011-03-02 DIAGNOSIS — O923 Agalactia: Secondary | ICD-10-CM | POA: Insufficient documentation

## 2011-04-02 ENCOUNTER — Encounter (HOSPITAL_COMMUNITY)
Admission: RE | Admit: 2011-04-02 | Discharge: 2011-04-02 | Disposition: A | Discharge status: Home / Self Care | Payer: BC Managed Care – PPO | Source: Ambulatory Visit | Attending: Obstetrics and Gynecology | Admitting: Obstetrics and Gynecology

## 2011-04-02 DIAGNOSIS — O923 Agalactia: Secondary | ICD-10-CM | POA: Insufficient documentation

## 2011-05-03 ENCOUNTER — Encounter (HOSPITAL_COMMUNITY)
Admission: RE | Admit: 2011-05-03 | Discharge: 2011-05-03 | Disposition: A | Discharge status: Home / Self Care | Payer: BC Managed Care – PPO | Source: Ambulatory Visit | Attending: Obstetrics and Gynecology | Admitting: Obstetrics and Gynecology

## 2011-05-03 DIAGNOSIS — O923 Agalactia: Secondary | ICD-10-CM | POA: Insufficient documentation

## 2013-12-03 ENCOUNTER — Encounter (HOSPITAL_COMMUNITY): Payer: Self-pay | Admitting: Obstetrics and Gynecology

## 2014-04-16 ENCOUNTER — Other Ambulatory Visit: Payer: Self-pay

## 2014-04-18 LAB — CYTOLOGY - PAP

## 2015-06-26 DIAGNOSIS — N898 Other specified noninflammatory disorders of vagina: Secondary | ICD-10-CM | POA: Diagnosis not present

## 2015-07-07 DIAGNOSIS — S60450A Superficial foreign body of right index finger, initial encounter: Secondary | ICD-10-CM | POA: Diagnosis not present

## 2015-11-28 DIAGNOSIS — Z23 Encounter for immunization: Secondary | ICD-10-CM | POA: Diagnosis not present

## 2016-02-02 HISTORY — PX: AUGMENTATION MAMMAPLASTY: SUR837

## 2016-02-05 DIAGNOSIS — D1801 Hemangioma of skin and subcutaneous tissue: Secondary | ICD-10-CM | POA: Diagnosis not present

## 2016-02-05 DIAGNOSIS — L570 Actinic keratosis: Secondary | ICD-10-CM | POA: Diagnosis not present

## 2016-02-05 DIAGNOSIS — L739 Follicular disorder, unspecified: Secondary | ICD-10-CM | POA: Diagnosis not present

## 2016-02-05 DIAGNOSIS — L814 Other melanin hyperpigmentation: Secondary | ICD-10-CM | POA: Diagnosis not present

## 2016-02-05 DIAGNOSIS — D225 Melanocytic nevi of trunk: Secondary | ICD-10-CM | POA: Diagnosis not present

## 2016-02-05 DIAGNOSIS — D485 Neoplasm of uncertain behavior of skin: Secondary | ICD-10-CM | POA: Diagnosis not present

## 2016-03-08 DIAGNOSIS — Z30433 Encounter for removal and reinsertion of intrauterine contraceptive device: Secondary | ICD-10-CM | POA: Diagnosis not present

## 2016-04-16 ENCOUNTER — Emergency Department (HOSPITAL_COMMUNITY)
Admission: EM | Admit: 2016-04-16 | Discharge: 2016-04-16 | Disposition: A | Discharge status: Home / Self Care | Payer: BLUE CROSS/BLUE SHIELD | Attending: Emergency Medicine | Admitting: Emergency Medicine

## 2016-04-16 ENCOUNTER — Encounter (HOSPITAL_COMMUNITY): Payer: Self-pay | Admitting: Emergency Medicine

## 2016-04-16 DIAGNOSIS — S01111A Laceration without foreign body of right eyelid and periocular area, initial encounter: Secondary | ICD-10-CM | POA: Insufficient documentation

## 2016-04-16 DIAGNOSIS — Y939 Activity, unspecified: Secondary | ICD-10-CM | POA: Insufficient documentation

## 2016-04-16 DIAGNOSIS — Y999 Unspecified external cause status: Secondary | ICD-10-CM | POA: Insufficient documentation

## 2016-04-16 DIAGNOSIS — Z79899 Other long term (current) drug therapy: Secondary | ICD-10-CM | POA: Insufficient documentation

## 2016-04-16 DIAGNOSIS — Z23 Encounter for immunization: Secondary | ICD-10-CM | POA: Insufficient documentation

## 2016-04-16 DIAGNOSIS — Y929 Unspecified place or not applicable: Secondary | ICD-10-CM | POA: Insufficient documentation

## 2016-04-16 MED ORDER — ONDANSETRON 4 MG PO TBDP
8.0000 mg | ORAL_TABLET | Freq: Once | ORAL | Status: AC
  Administered 2016-04-16: 8 mg via ORAL
  Filled 2016-04-16: qty 2

## 2016-04-16 MED ORDER — LIDOCAINE-EPINEPHRINE (PF) 2 %-1:200000 IJ SOLN
10.0000 mL | Freq: Once | INTRAMUSCULAR | Status: AC
  Administered 2016-04-16: 10 mL
  Filled 2016-04-16: qty 20

## 2016-04-16 MED ORDER — TETANUS-DIPHTH-ACELL PERTUSSIS 5-2.5-18.5 LF-MCG/0.5 IM SUSP
0.5000 mL | Freq: Once | INTRAMUSCULAR | Status: AC
  Administered 2016-04-16: 0.5 mL via INTRAMUSCULAR
  Filled 2016-04-16: qty 0.5

## 2016-04-16 MED ORDER — ONDANSETRON 4 MG PO TBDP: 4.0000 mg | ORAL_TABLET | Freq: Three times a day (TID) | ORAL | 0 refills | Status: AC | PRN
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
## 2016-04-16 NOTE — Discharge Instructions (Signed)
Take ibuprofen or tylenol of pain or headache. Take Zofran as needed for nausea.
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
## 2016-04-21 DIAGNOSIS — Z4889 Encounter for other specified surgical aftercare: Secondary | ICD-10-CM | POA: Diagnosis not present

## 2016-04-21 DIAGNOSIS — T7411XA Adult physical abuse, confirmed, initial encounter: Secondary | ICD-10-CM | POA: Diagnosis not present

## 2016-04-22 DIAGNOSIS — Z6823 Body mass index (BMI) 23.0-23.9, adult: Secondary | ICD-10-CM | POA: Diagnosis not present

## 2016-04-22 DIAGNOSIS — Z01419 Encounter for gynecological examination (general) (routine) without abnormal findings: Secondary | ICD-10-CM | POA: Diagnosis not present

## 2016-04-22 DIAGNOSIS — Z1231 Encounter for screening mammogram for malignant neoplasm of breast: Secondary | ICD-10-CM | POA: Diagnosis not present

## 2016-11-12 DIAGNOSIS — Z23 Encounter for immunization: Secondary | ICD-10-CM | POA: Diagnosis not present

## 2017-02-17 DIAGNOSIS — L820 Inflamed seborrheic keratosis: Secondary | ICD-10-CM | POA: Diagnosis not present

## 2017-02-17 DIAGNOSIS — L814 Other melanin hyperpigmentation: Secondary | ICD-10-CM | POA: Diagnosis not present

## 2017-02-17 DIAGNOSIS — L821 Other seborrheic keratosis: Secondary | ICD-10-CM | POA: Diagnosis not present

## 2017-02-17 DIAGNOSIS — L570 Actinic keratosis: Secondary | ICD-10-CM | POA: Diagnosis not present

## 2017-02-17 DIAGNOSIS — D1801 Hemangioma of skin and subcutaneous tissue: Secondary | ICD-10-CM | POA: Diagnosis not present

## 2017-02-17 DIAGNOSIS — D225 Melanocytic nevi of trunk: Secondary | ICD-10-CM | POA: Diagnosis not present

## 2017-03-08 DIAGNOSIS — I87393 Chronic venous hypertension (idiopathic) with other complications of bilateral lower extremity: Secondary | ICD-10-CM | POA: Diagnosis not present

## 2017-04-06 DIAGNOSIS — L820 Inflamed seborrheic keratosis: Secondary | ICD-10-CM | POA: Diagnosis not present

## 2017-04-06 DIAGNOSIS — L570 Actinic keratosis: Secondary | ICD-10-CM | POA: Diagnosis not present

## 2017-05-03 DIAGNOSIS — Z01419 Encounter for gynecological examination (general) (routine) without abnormal findings: Secondary | ICD-10-CM | POA: Diagnosis not present

## 2017-05-03 DIAGNOSIS — Z6821 Body mass index (BMI) 21.0-21.9, adult: Secondary | ICD-10-CM | POA: Diagnosis not present

## 2017-05-03 DIAGNOSIS — Z1231 Encounter for screening mammogram for malignant neoplasm of breast: Secondary | ICD-10-CM | POA: Diagnosis not present

## 2017-11-04 DIAGNOSIS — Z23 Encounter for immunization: Secondary | ICD-10-CM | POA: Diagnosis not present

## 2018-02-10 DIAGNOSIS — L570 Actinic keratosis: Secondary | ICD-10-CM | POA: Diagnosis not present

## 2018-02-10 DIAGNOSIS — D2371 Other benign neoplasm of skin of right lower limb, including hip: Secondary | ICD-10-CM | POA: Diagnosis not present

## 2018-04-14 DIAGNOSIS — C44319 Basal cell carcinoma of skin of other parts of face: Secondary | ICD-10-CM | POA: Diagnosis not present

## 2018-06-13 DIAGNOSIS — C44319 Basal cell carcinoma of skin of other parts of face: Secondary | ICD-10-CM | POA: Diagnosis not present

## 2018-06-13 DIAGNOSIS — L570 Actinic keratosis: Secondary | ICD-10-CM | POA: Diagnosis not present

## 2018-06-28 DIAGNOSIS — B373 Candidiasis of vulva and vagina: Secondary | ICD-10-CM | POA: Diagnosis not present

## 2018-06-28 DIAGNOSIS — Z1231 Encounter for screening mammogram for malignant neoplasm of breast: Secondary | ICD-10-CM | POA: Diagnosis not present

## 2018-06-28 DIAGNOSIS — Z01419 Encounter for gynecological examination (general) (routine) without abnormal findings: Secondary | ICD-10-CM | POA: Diagnosis not present

## 2018-06-28 DIAGNOSIS — Z6823 Body mass index (BMI) 23.0-23.9, adult: Secondary | ICD-10-CM | POA: Diagnosis not present

## 2018-07-20 ENCOUNTER — Other Ambulatory Visit: Payer: Self-pay | Admitting: Obstetrics & Gynecology

## 2018-07-20 ENCOUNTER — Other Ambulatory Visit: Payer: Self-pay | Admitting: Internal Medicine

## 2018-07-20 DIAGNOSIS — N6049 Mammary duct ectasia of unspecified breast: Secondary | ICD-10-CM | POA: Diagnosis not present

## 2018-07-20 DIAGNOSIS — N644 Mastodynia: Secondary | ICD-10-CM

## 2018-07-25 ENCOUNTER — Other Ambulatory Visit: Payer: Self-pay | Admitting: Obstetrics & Gynecology

## 2018-07-25 DIAGNOSIS — Z803 Family history of malignant neoplasm of breast: Secondary | ICD-10-CM

## 2018-07-31 ENCOUNTER — Ambulatory Visit
Admission: RE | Admit: 2018-07-31 | Discharge: 2018-07-31 | Disposition: A | Discharge status: Home / Self Care | Payer: BC Managed Care – PPO | Source: Ambulatory Visit | Attending: Obstetrics & Gynecology | Admitting: Obstetrics & Gynecology

## 2018-07-31 ENCOUNTER — Other Ambulatory Visit: Payer: BLUE CROSS/BLUE SHIELD

## 2018-07-31 ENCOUNTER — Other Ambulatory Visit: Payer: Self-pay

## 2018-07-31 ENCOUNTER — Other Ambulatory Visit: Payer: Self-pay | Admitting: Obstetrics & Gynecology

## 2018-07-31 DIAGNOSIS — N644 Mastodynia: Secondary | ICD-10-CM

## 2018-07-31 DIAGNOSIS — R922 Inconclusive mammogram: Secondary | ICD-10-CM | POA: Diagnosis not present

## 2018-09-15 ENCOUNTER — Other Ambulatory Visit: Payer: Self-pay

## 2018-09-15 DIAGNOSIS — Z20822 Contact with and (suspected) exposure to covid-19: Secondary | ICD-10-CM

## 2018-09-16 LAB — NOVEL CORONAVIRUS, NAA: SARS-CoV-2, NAA: NOT DETECTED

## 2018-11-10 DIAGNOSIS — Z23 Encounter for immunization: Secondary | ICD-10-CM | POA: Diagnosis not present

## 2019-02-14 DIAGNOSIS — L905 Scar conditions and fibrosis of skin: Secondary | ICD-10-CM | POA: Diagnosis not present

## 2019-02-14 DIAGNOSIS — D225 Melanocytic nevi of trunk: Secondary | ICD-10-CM | POA: Diagnosis not present

## 2019-02-14 DIAGNOSIS — D1801 Hemangioma of skin and subcutaneous tissue: Secondary | ICD-10-CM | POA: Diagnosis not present

## 2019-02-14 DIAGNOSIS — Z85828 Personal history of other malignant neoplasm of skin: Secondary | ICD-10-CM | POA: Diagnosis not present

## 2019-02-26 ENCOUNTER — Other Ambulatory Visit: Payer: Self-pay | Admitting: Internal Medicine

## 2019-02-27 LAB — NOVEL CORONAVIRUS, NAA: SARS-CoV-2, NAA: NOT DETECTED

## 2019-08-16 DIAGNOSIS — Z6821 Body mass index (BMI) 21.0-21.9, adult: Secondary | ICD-10-CM | POA: Diagnosis not present

## 2019-08-16 DIAGNOSIS — Z1231 Encounter for screening mammogram for malignant neoplasm of breast: Secondary | ICD-10-CM | POA: Diagnosis not present

## 2019-08-16 DIAGNOSIS — Z01419 Encounter for gynecological examination (general) (routine) without abnormal findings: Secondary | ICD-10-CM | POA: Diagnosis not present

## 2019-11-30 DIAGNOSIS — Z23 Encounter for immunization: Secondary | ICD-10-CM | POA: Diagnosis not present

## 2020-03-03 DIAGNOSIS — L738 Other specified follicular disorders: Secondary | ICD-10-CM | POA: Diagnosis not present

## 2020-08-11 IMAGING — MG DIGITAL DIAGNOSTIC UNILATERAL RIGHT MAMMOGRAM WITH TOMO AND CAD
2 series · 3 of 6 positions shown · non-contrast
Comparison: Previous exam(s).

CLINICAL DATA: 42-year-old female with RIGHT nipple pain for 1
month.

EXAM:
DIGITAL DIAGNOSTIC RIGHT MAMMOGRAM WITH CAD AND TOMO
ULTRASOUND RIGHT BREAST

[R MLO synth-2D]
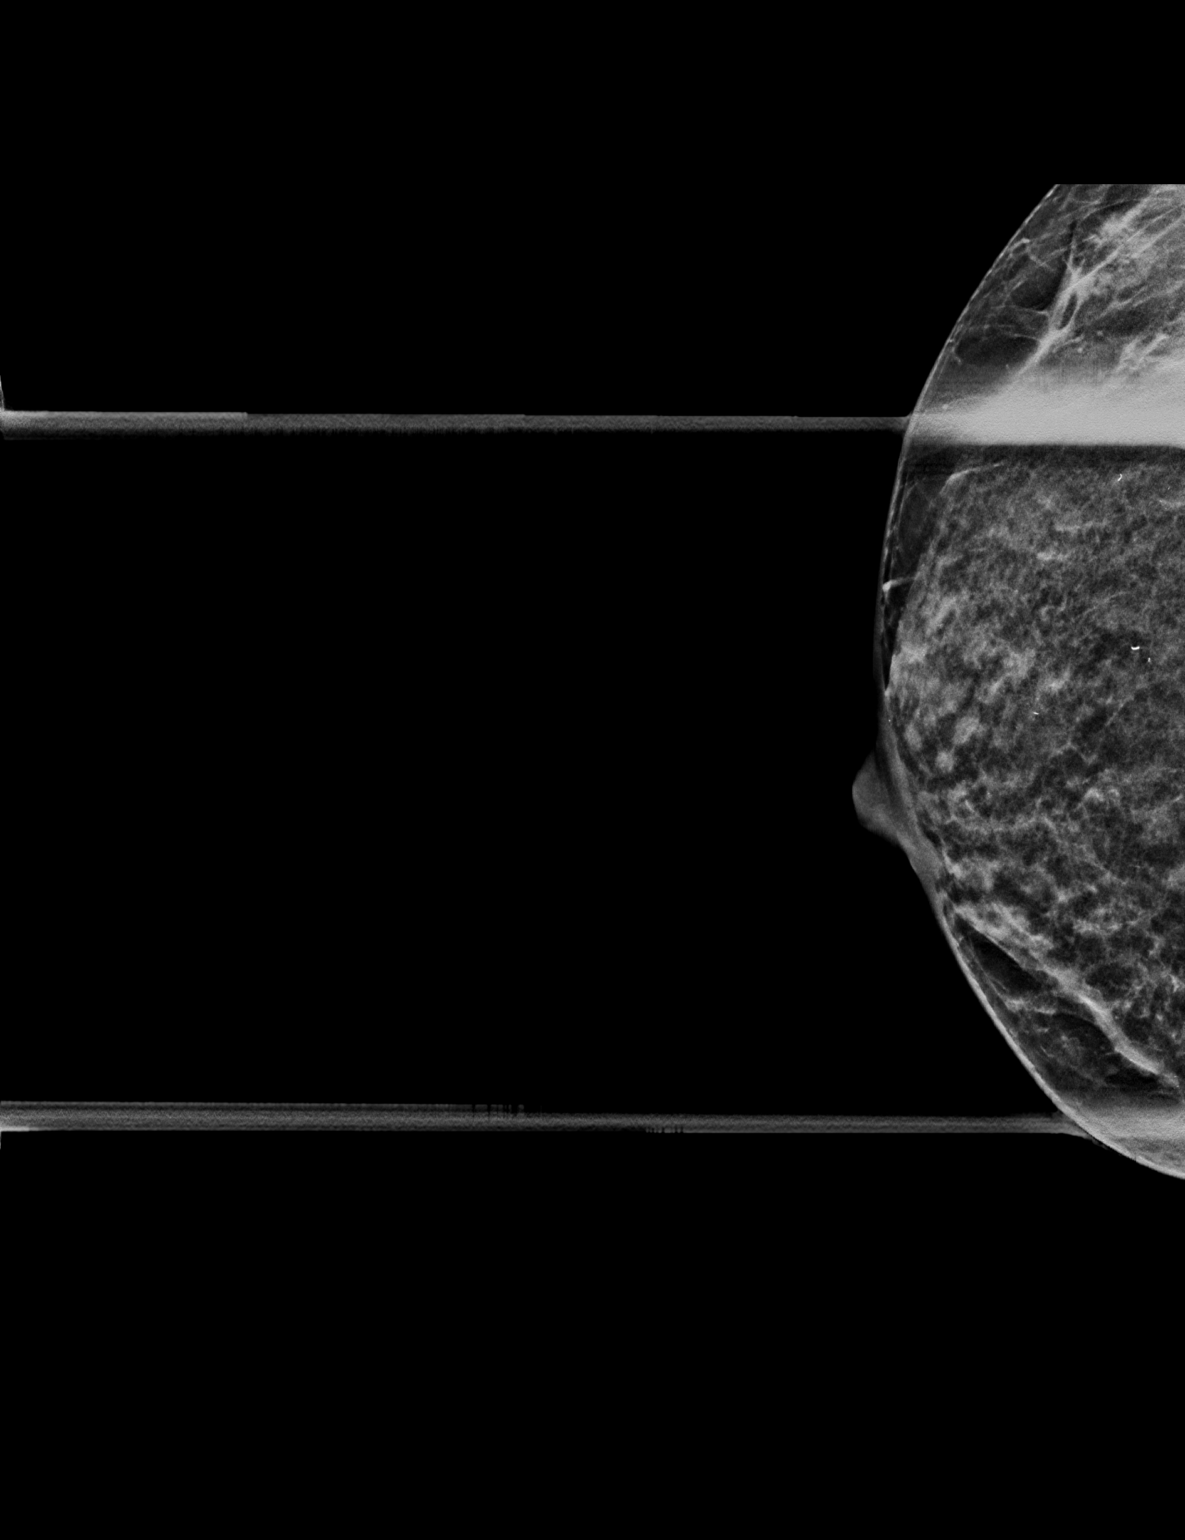

[R MLO tomo · 2 of 30 frames shown]
[frame 10/30]
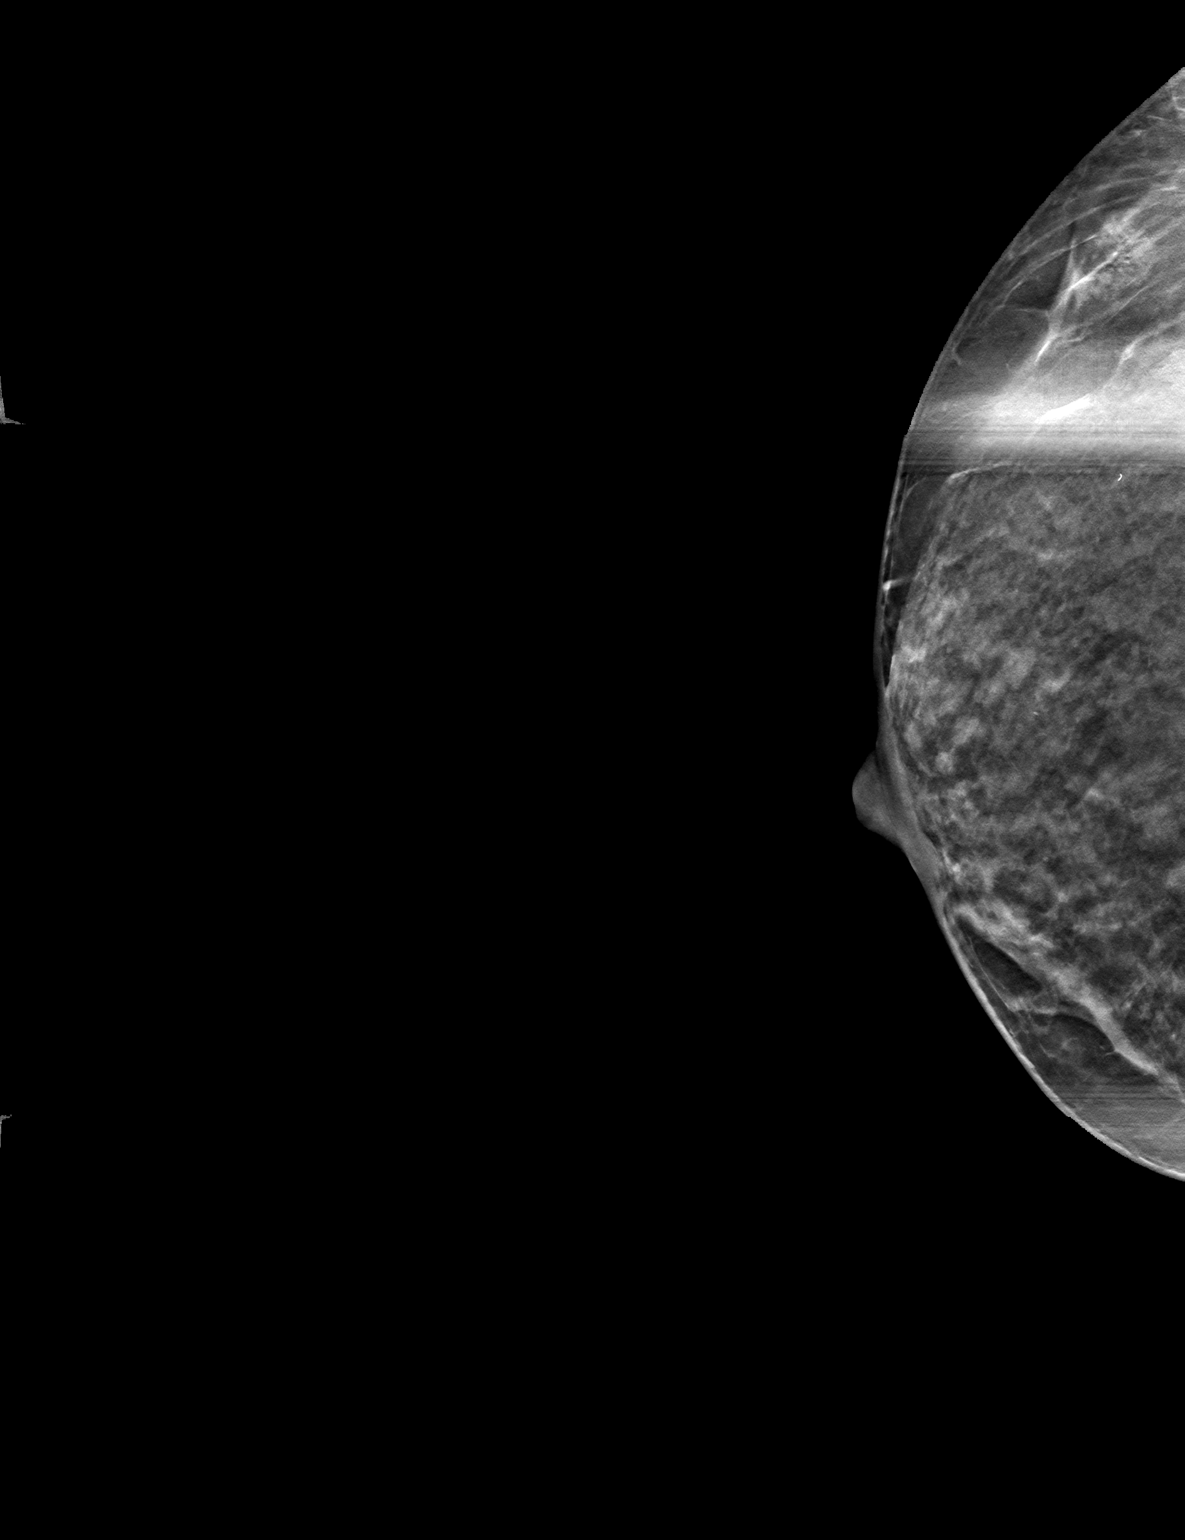
[frame 15/30]
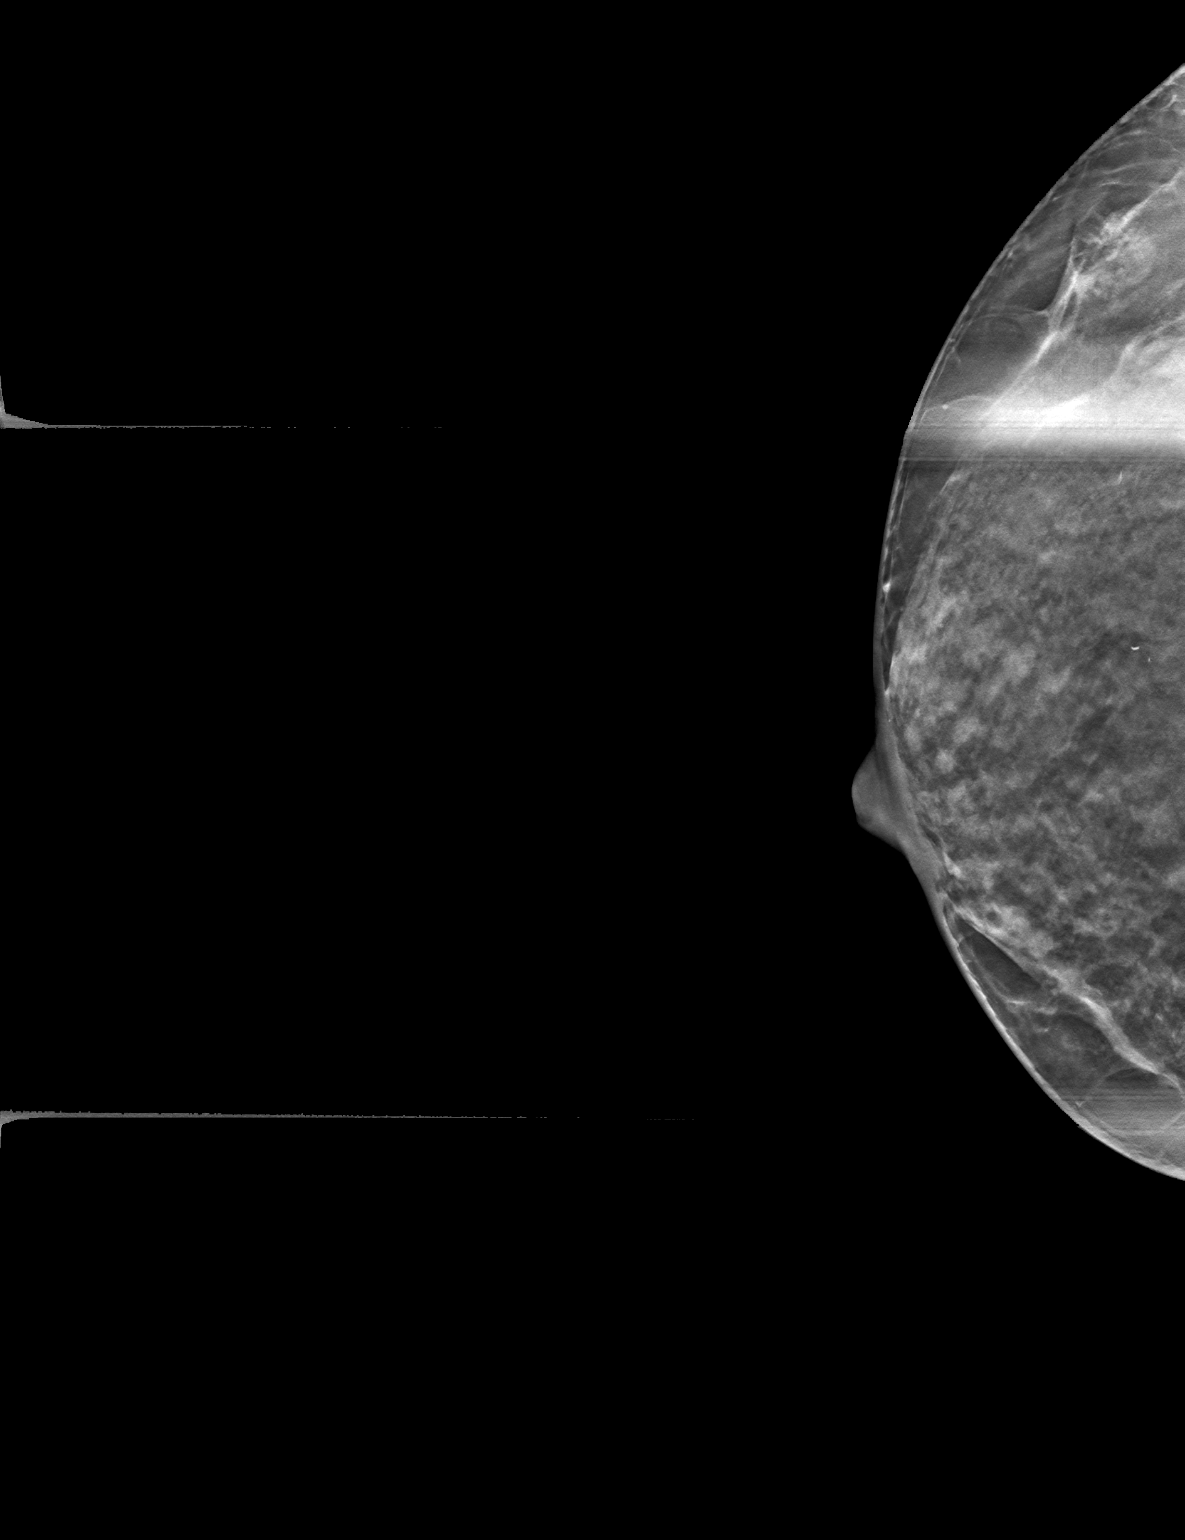

[3 of 6 positions shown; findings below may reference images not displayed]

ACR Breast Density Category c: The breast tissue is heterogeneously
dense, which may obscure small masses.
FINDINGS: 2D/3D spot compression view of the RETROAREOLAR RIGHT breast
demonstrate no suspicious mass, distortion or worrisome
calcifications.

Mammographic images were processed with CAD.

On physical exam, the RIGHT nipple is unremarkable and appears
similar to the LEFT. No palpable abnormalities are identified in the
RETROAREOLAR RIGHT breast.

Targeted ultrasound is performed, showing no solid or cystic mass,
distortion or abnormal shadowing within the RETROAREOLAR RIGHT
breast.
IMPRESSION: 1. No mammographic, palpable or sonographic abnormality within the
RETROAREOLAR RIGHT breast. The RIGHT nipple appears normal and
symmetric to the LEFT. Clinical follow-up as indicated.

RECOMMENDATION:
Bilateral screening mammogram in June 2019 to resume annual mammogram
schedule.

Clinical follow-up recommended for the area of concern in the RIGHT
breast. Any further workup should be based on clinical grounds.

I have discussed the findings, causes of nipple and breast pain and
recommendations with the patient. Results were also provided in
writing at the conclusion of the visit. If applicable, a reminder
letter will be sent to the patient regarding the next appointment.

BI-RADS CATEGORY  1: Negative.

## 2020-08-11 IMAGING — US ULTRASOUND RIGHT BREAST LIMITED
1 series · 2 of 2 positions shown · non-contrast
Comparison: Previous exam(s).

CLINICAL DATA: 42-year-old female with RIGHT nipple pain for 1
month.

EXAM:
DIGITAL DIAGNOSTIC RIGHT MAMMOGRAM WITH CAD AND TOMO
ULTRASOUND RIGHT BREAST

[Series 1: ultrasound right breast limited · 0.05mm/px · 2 of 2 slices shown]
[im 1/2]
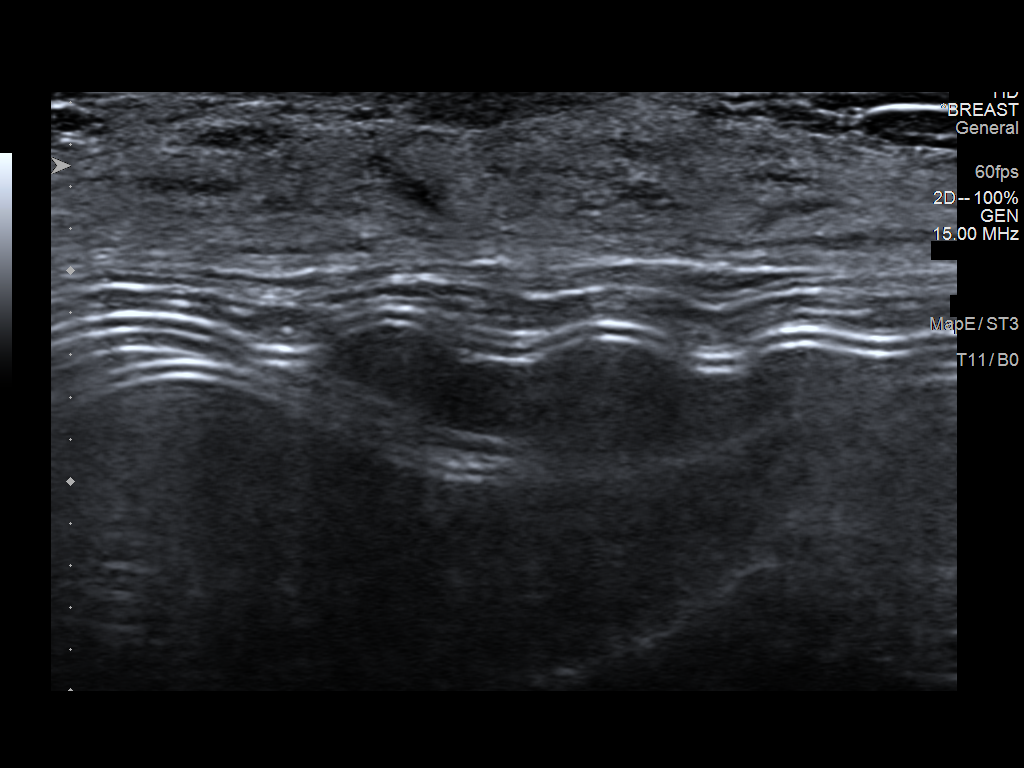
[im 2/2]
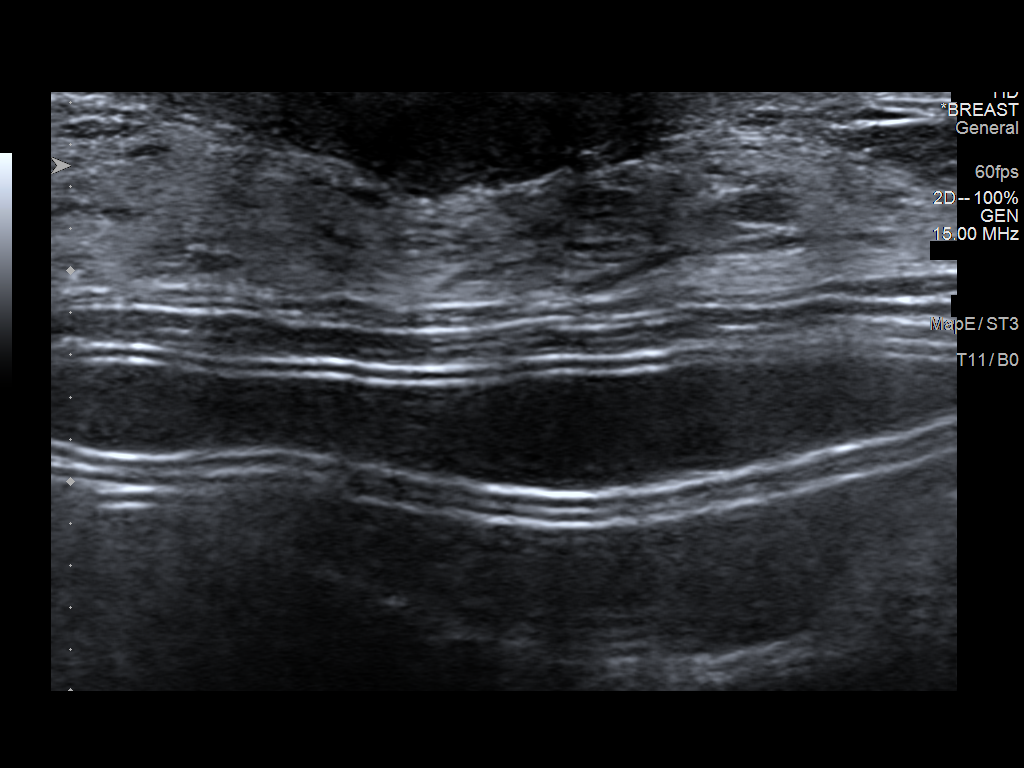

[2 of 2 positions shown; findings below may reference images not displayed]

ACR Breast Density Category c: The breast tissue is heterogeneously
dense, which may obscure small masses.
FINDINGS: 2D/3D spot compression view of the RETROAREOLAR RIGHT breast
demonstrate no suspicious mass, distortion or worrisome
calcifications.

Mammographic images were processed with CAD.

On physical exam, the RIGHT nipple is unremarkable and appears
similar to the LEFT. No palpable abnormalities are identified in the
RETROAREOLAR RIGHT breast.

Targeted ultrasound is performed, showing no solid or cystic mass,
distortion or abnormal shadowing within the RETROAREOLAR RIGHT
breast.
IMPRESSION: 1. No mammographic, palpable or sonographic abnormality within the
RETROAREOLAR RIGHT breast. The RIGHT nipple appears normal and
symmetric to the LEFT. Clinical follow-up as indicated.

RECOMMENDATION:
Bilateral screening mammogram in June 2019 to resume annual mammogram
schedule.

Clinical follow-up recommended for the area of concern in the RIGHT
breast. Any further workup should be based on clinical grounds.

I have discussed the findings, causes of nipple and breast pain and
recommendations with the patient. Results were also provided in
writing at the conclusion of the visit. If applicable, a reminder
letter will be sent to the patient regarding the next appointment.

BI-RADS CATEGORY  1: Negative.

## 2020-08-27 DIAGNOSIS — Z01419 Encounter for gynecological examination (general) (routine) without abnormal findings: Secondary | ICD-10-CM | POA: Diagnosis not present

## 2020-08-27 DIAGNOSIS — Z1231 Encounter for screening mammogram for malignant neoplasm of breast: Secondary | ICD-10-CM | POA: Diagnosis not present

## 2020-08-27 DIAGNOSIS — Z6821 Body mass index (BMI) 21.0-21.9, adult: Secondary | ICD-10-CM | POA: Diagnosis not present

## 2021-02-10 DIAGNOSIS — L578 Other skin changes due to chronic exposure to nonionizing radiation: Secondary | ICD-10-CM | POA: Diagnosis not present

## 2021-02-10 DIAGNOSIS — D485 Neoplasm of uncertain behavior of skin: Secondary | ICD-10-CM | POA: Diagnosis not present

## 2021-02-10 DIAGNOSIS — L821 Other seborrheic keratosis: Secondary | ICD-10-CM | POA: Diagnosis not present

## 2021-02-10 DIAGNOSIS — L814 Other melanin hyperpigmentation: Secondary | ICD-10-CM | POA: Diagnosis not present

## 2021-02-10 DIAGNOSIS — D225 Melanocytic nevi of trunk: Secondary | ICD-10-CM | POA: Diagnosis not present

## 2021-11-02 DIAGNOSIS — Z01419 Encounter for gynecological examination (general) (routine) without abnormal findings: Secondary | ICD-10-CM | POA: Diagnosis not present

## 2021-11-02 DIAGNOSIS — Z1151 Encounter for screening for human papillomavirus (HPV): Secondary | ICD-10-CM | POA: Diagnosis not present

## 2021-11-02 DIAGNOSIS — Z113 Encounter for screening for infections with a predominantly sexual mode of transmission: Secondary | ICD-10-CM | POA: Diagnosis not present

## 2021-11-02 DIAGNOSIS — Z1231 Encounter for screening mammogram for malignant neoplasm of breast: Secondary | ICD-10-CM | POA: Diagnosis not present

## 2021-11-02 DIAGNOSIS — Z6825 Body mass index (BMI) 25.0-25.9, adult: Secondary | ICD-10-CM | POA: Diagnosis not present

## 2021-11-02 DIAGNOSIS — Z124 Encounter for screening for malignant neoplasm of cervix: Secondary | ICD-10-CM | POA: Diagnosis not present

## 2022-11-18 DIAGNOSIS — Z1231 Encounter for screening mammogram for malignant neoplasm of breast: Secondary | ICD-10-CM | POA: Diagnosis not present

## 2022-11-18 DIAGNOSIS — Z6828 Body mass index (BMI) 28.0-28.9, adult: Secondary | ICD-10-CM | POA: Diagnosis not present

## 2022-11-18 DIAGNOSIS — Z01419 Encounter for gynecological examination (general) (routine) without abnormal findings: Secondary | ICD-10-CM | POA: Diagnosis not present

## 2023-09-06 DIAGNOSIS — F411 Generalized anxiety disorder: Secondary | ICD-10-CM | POA: Diagnosis not present

## 2023-09-06 DIAGNOSIS — Z0000 Encounter for general adult medical examination without abnormal findings: Secondary | ICD-10-CM | POA: Diagnosis not present

## 2023-09-06 DIAGNOSIS — R5383 Other fatigue: Secondary | ICD-10-CM | POA: Diagnosis not present

## 2023-09-09 DIAGNOSIS — Z13220 Encounter for screening for lipoid disorders: Secondary | ICD-10-CM | POA: Diagnosis not present

## 2023-09-09 DIAGNOSIS — R635 Abnormal weight gain: Secondary | ICD-10-CM | POA: Diagnosis not present

## 2023-09-09 DIAGNOSIS — R5383 Other fatigue: Secondary | ICD-10-CM | POA: Diagnosis not present

## 2023-09-12 ENCOUNTER — Other Ambulatory Visit (HOSPITAL_BASED_OUTPATIENT_CLINIC_OR_DEPARTMENT_OTHER): Payer: Self-pay | Admitting: Family Medicine

## 2023-09-12 DIAGNOSIS — E785 Hyperlipidemia, unspecified: Secondary | ICD-10-CM

## 2023-10-06 ENCOUNTER — Ambulatory Visit (HOSPITAL_BASED_OUTPATIENT_CLINIC_OR_DEPARTMENT_OTHER)
Admission: RE | Admit: 2023-10-06 | Discharge: 2023-10-06 | Disposition: A | Discharge status: Home / Self Care | Payer: Self-pay | Source: Ambulatory Visit | Attending: Family Medicine | Admitting: Family Medicine

## 2023-10-06 DIAGNOSIS — E785 Hyperlipidemia, unspecified: Secondary | ICD-10-CM | POA: Insufficient documentation

## 2023-11-29 DIAGNOSIS — Z1231 Encounter for screening mammogram for malignant neoplasm of breast: Secondary | ICD-10-CM | POA: Diagnosis not present

## 2023-11-29 DIAGNOSIS — Z6827 Body mass index (BMI) 27.0-27.9, adult: Secondary | ICD-10-CM | POA: Diagnosis not present

## 2023-11-29 DIAGNOSIS — Z01419 Encounter for gynecological examination (general) (routine) without abnormal findings: Secondary | ICD-10-CM | POA: Diagnosis not present

## 2023-12-16 DIAGNOSIS — Z1211 Encounter for screening for malignant neoplasm of colon: Secondary | ICD-10-CM | POA: Diagnosis not present
